# Patient Record
Sex: Male | Born: 1985 | Race: Black or African American | Hispanic: No | Marital: Single | Smoking: Current every day smoker
Health system: Southern US, Community
[De-identification: ages and names within clinical notes are randomized; demographics above are authoritative.]

## PROBLEM LIST (undated history)

## (undated) DIAGNOSIS — J45909 Unspecified asthma, uncomplicated: Secondary | ICD-10-CM

---
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
## 2011-09-15 ENCOUNTER — Emergency Department (HOSPITAL_COMMUNITY)
Admission: EM | Admit: 2011-09-15 | Discharge: 2011-09-15 | Disposition: A | Discharge status: Home / Self Care | Payer: Self-pay | Attending: Emergency Medicine | Admitting: Emergency Medicine

## 2011-09-15 ENCOUNTER — Encounter (HOSPITAL_COMMUNITY): Payer: Self-pay | Admitting: *Deleted

## 2011-09-15 DIAGNOSIS — J45901 Unspecified asthma with (acute) exacerbation: Secondary | ICD-10-CM | POA: Insufficient documentation

## 2011-09-15 HISTORY — DX: Unspecified asthma, uncomplicated: J45.909

## 2011-09-15 MED ORDER — ALBUTEROL SULFATE (5 MG/ML) 0.5% IN NEBU
5.0000 mg | INHALATION_SOLUTION | Freq: Once | RESPIRATORY_TRACT | Status: AC
  Administered 2011-09-15: 5 mg via RESPIRATORY_TRACT
  Filled 2011-09-15: qty 1

## 2011-09-15 MED ORDER — ALBUTEROL SULFATE HFA 108 (90 BASE) MCG/ACT IN AERS
2.0000 | INHALATION_SPRAY | Freq: Four times a day (QID) | RESPIRATORY_TRACT | Status: DC
  Administered 2011-09-15: 2 via RESPIRATORY_TRACT
  Filled 2011-09-15: qty 6.7

## 2011-09-15 MED ORDER — IPRATROPIUM BROMIDE 0.02 % IN SOLN
0.5000 mg | Freq: Once | RESPIRATORY_TRACT | Status: AC
  Administered 2011-09-15: 0.5 mg via RESPIRATORY_TRACT
  Filled 2011-09-15: qty 2.5

## 2011-09-15 MED ORDER — ALBUTEROL SULFATE (2.5 MG/3ML) 0.083% IN NEBU: 2.5000 mg | INHALATION_SOLUTION | Freq: Four times a day (QID) | RESPIRATORY_TRACT | Status: DC | PRN

## 2011-09-15 NOTE — ED Notes (Signed)
Patient with complains of sob and chest tightness.  Patient used his inhaler today with some relief.  He is out of inhaler at this time.  He denies fever

## 2011-09-15 NOTE — ED Provider Notes (Signed)
History     CSN: [id number]  Arrival date & time 09/15/11  1738   First MD Initiated Contact with Patient 09/15/11 2011      Chief Complaint  Patient presents with  . Asthma    (Consider location/radiation/quality/duration/timing/severity/associated sxs/prior treatment) HPI Comments: Patient with a history of asthma presents emergency department with chief complaint of shortness of breath and chest tightness.  He states that he is out of his albuterol inhaler and that he just moved to the area.  Patient reports that he did not have a primary care provider at this time.  He denies any cough, fever, night sweats, chills, chest pain, claudication, hemoptysis.  No other complaints at this time.  Patient is a 26 y.o. male presenting with asthma. The history is provided by the patient.  Asthma    Past Medical History  Diagnosis Date  . Asthma     History reviewed. No pertinent past surgical history.  No family history on file.  History  Substance Use Topics  . Smoking status: Never Smoker   . Smokeless tobacco: Not on file  . Alcohol Use: No      Review of Systems  All other systems reviewed and are negative.    Allergies  Shellfish allergy  Home Medications   Current Outpatient Rx  Name Route Sig Dispense Refill  . ALBUTEROL SULFATE HFA 108 (90 BASE) MCG/ACT IN AERS Inhalation Inhale 1 puff into the lungs every 6 (six) hours as needed. For shortness of breath    . ALBUTEROL SULFATE (2.5 MG/3ML) 0.083% IN NEBU Nebulization Take 2.5 mg by nebulization every 6 (six) hours as needed. For shortness of breath/wheezing      BP 118/72  Pulse 63  Temp 98.2 F (36.8 C) (Oral)  Resp 18  SpO2 98%  Physical Exam  Constitutional: He is oriented to person, place, and time. He appears well-developed and well-nourished. No distress.  HENT:  Head: Normocephalic and atraumatic.       Uvula midline, oropharynx clear and moist  Eyes: Conjunctivae and EOM are normal. Pupils  are equal, round, and reactive to light.  Neck: Normal range of motion.       No stridor.  Neck supple with full range of motion.  Cardiovascular: Normal rate, regular rhythm, normal heart sounds and intact distal pulses.   Pulmonary/Chest: Effort normal. He has wheezes (expiratory). He exhibits tenderness.       Patient not in respiratory distress, no accessory muscle use, nasal flaring.  Patient able to speak in full sentences.  Airway intact.  Lung auscultation positive for bilateral expiratory wheezing  Abdominal: Soft. There is no tenderness.  Musculoskeletal: Normal range of motion. He exhibits no edema and no tenderness.  Neurological: He is alert and oriented to person, place, and time.  Skin: Skin is warm and dry. No rash noted. He is not diaphoretic.  Psychiatric: He has a normal mood and affect. His behavior is normal.    ED Course  Procedures (including critical care time)  Labs Reviewed - No data to display No results found.   No diagnosis found.    MDM  Asthma exacerbation  Patient ambulated in ED with O2 saturations maintained >90, no current signs of respiratory distress. Lung exam improved after hospital treatment. Pt states they are breathing at baseline. Pt has been instructed to continue using prescribed medications and to speak with PCP about today's exacerbation.           Jaci Carrel, [place] 09/15/11  2044 

## 2011-09-15 NOTE — Discharge Instructions (Signed)
Followup with your doctor in regards to your asthma exacerbation. Read the instructions below to learn more about factors that can trigger asthma. Use her albuterol inhaler as directed. Your more than welcome to return to the emergency department if you become short of breath, your albuterol inhaler did not relieve symptoms, you are having chest pain associated with difficulty breathing, or any symptoms that are concerning to you.    IDENTIFY AND CONTROL FACTORS THAT MAKE YOUR ASTHMA WORSE A number of common things can set off or make your asthma symptoms worse (asthma triggers). Keep track of your asthma symptoms for several weeks, detailing all the environmental and emotional factors that are linked with your asthma. When you have an asthma attack, go back to your asthma diary to see which factor, or combination of factors, might have contributed to it. Once you know what these factors are, you can take steps to control many of them.  Allergies: If you have allergies and asthma, it is important to take asthma prevention steps at home. Asthma attacks (worsening of asthma symptoms) can be triggered by allergies, which can cause temporary increased inflammation of your airways. Minimizing contact with the substance to which you are allergic will help prevent an asthma attack. Animal Dander:   Some people are allergic to the flakes of skin or dried saliva from animals with fur or feathers. Keep these pets out of your home.   If you can't keep a pet outdoors, keep the pet out of your bedroom and other sleeping areas at all times, and keep the door closed.   Remove carpets and furniture covered with cloth from your home. If that is not possible, keep the pet away from fabric-covered furniture and carpets.  Dust Mites:  Many people with asthma are allergic to dust mites. Dust mites are tiny bugs that are found in every home, in mattresses, pillows, carpets, fabric-covered furniture, bedcovers, clothes,  stuffed toys, fabric, and other fabric-covered items.   Cover your mattress in a special dust-proof cover.   Cover your pillow in a special dust-proof cover, or wash the pillow each week in hot water. Water must be hotter than 130 F to kill dust mites. Cold or warm water used with detergent and bleach can also be effective.   Wash the sheets and blankets on your bed each week in hot water.   Try not to sleep or lie on cloth-covered cushions.   Call ahead when traveling and ask for a smoke-free hotel room. Bring your own bedding and pillows, in case the hotel only supplies feather pillows and down comforters, which may contain dust mites and cause asthma symptoms.   Remove carpets from your bedroom and those laid on concrete, if you can.   Keep stuffed toys out of the bed, or wash the toys weekly in hot water or cooler water with detergent and bleach.  Cockroaches:  Many people with asthma are allergic to the droppings and remains of cockroaches.   Keep food and garbage in closed containers. Never leave food out.   Use poison baits, traps, powders, gels, or paste (for example, boric acid).   If a spray is used to kill cockroaches, stay out of the room until the odor goes away.  Indoor Mold:  Fix leaky faucets, pipes, or other sources of water that have mold around them.   Clean moldy surfaces with a cleaner that has bleach in it.  Pollen and Outdoor Mold:  When pollen or mold spore counts   are high, try to keep your windows closed.   Stay indoors with windows closed from late morning to afternoon, if you can. Pollen and some mold spore counts are highest at that time.   Ask your caregiver whether you need to take or increase anti-inflammatory medicine before your allergy season starts.  Irritants:   Tobacco smoke is an irritant. If you smoke, ask your caregiver how you can quit. Ask family members to quit smoking, too. Do not allow smoking in your home or car.   If possible, do  not use a wood-burning stove, kerosene heater, or fireplace. Minimize exposure to all sources of smoke, including incense, candles, fires, and fireworks.   Try to stay away from strong odors and sprays, such as perfume, talcum powder, hair spray, and paints.   Decrease humidity in your home and use an indoor air cleaning device. Reduce indoor humidity to below 60 percent. Dehumidifiers or central air conditioners can do this.   Try to have someone else vacuum for you once or twice a week, if you can. Stay out of rooms while they are being vacuumed and for a short while afterward.   If you vacuum, use a dust mask from a hardware store, a double-layered or microfilter vacuum cleaner bag, or a vacuum cleaner with a HEPA filter.   Sulfites in foods and beverages can be irritants. Do not drink beer or wine, or eat dried fruit, processed potatoes, or shrimp if they cause asthma symptoms.   Cold air can trigger an asthma attack. Cover your nose and mouth with a scarf on cold or windy days.   Several health conditions can make asthma more difficult to manage, including runny nose, sinus infections, reflux disease, psychological stress, and sleep apnea. Your caregiver will treat these conditions, as well.   Avoid close contact with people who have a cold or the flu, since your asthma symptoms may get worse if you catch the infection from them. Wash your hands thoroughly after touching items that may have been handled by people with a respiratory infection.   Get a flu shot every year to protect against the flu virus, which often makes asthma worse for days or weeks. Also get a pneumonia shot once every five to 10 years.  Drugs:  Aspirin and other painkillers can cause asthma attacks. 10% to 20% of people with asthma have sensitivity to aspirin or a group of painkillers called non-steroidal anti-inflammatory drugs (NSAIDS), such as ibuprofen and naproxen. These drugs are used to treat pain and reduce  fevers. Asthma attacks caused by any of these medicines can be severe and even fatal. These drugs must be avoided in people who have known aspirin sensitive asthma. Products with acetaminophen are considered safe for people who have asthma. It is important that people with aspirin sensitivity read labels of all over-the-counter drugs used to treat pain, colds, coughs, and fever.   Beta blockers and ACE inhibitors are other drugs which you should discuss with your caregiver, in relation to your asthma.   EXERCISE  If you have exercise-induced asthma, or are planning vigorous exercise, or exercise in cold, humid, or dry environments, prevent exercise-induced asthma by following your caregiver's advice regarding asthma treatment before exercising.  RESOURCE GUIDE  Chronic Pain Problems: Contact [facility]  [phone] Patients need to be referred by their primary care doctor.  Insufficient Money for Medicine: Contact United Way:  call "211" or [facility] 271-5999.  No Primary Care Doctor: -   Call [facility]  832-8000 - can help you locate a primary care doctor that  accepts your insurance, provides certain services, etc. - Physician Referral Service- 1-800-533-3463  Agencies that provide inexpensive medical care: - [facility]  [phone] - [facility]  [phone] - [facility]  [phone] - [facility]  [phone] - Planned Parenthood  [phone] - [facility]  [phone]  Medicaid-accepting [place] Providers: - [facility]- [address]  [phone], Mon-Fri 9am-7pm, Sat 9am-1pm - [facility]- [address], [address]  [phone] - [facility]- [address]  [phone] - [facility]- [address] [place] [address]  [phone] - Ve[facility] B[facility]- [address], [phone]  Only accepts  [place] Access Medicaid patients after they have their name  applied to their card  Self Pay (no insurance) in [place]: - Sickle Cell Patients: Dr Eric Dean, [facility]  [address], [phone] - [facility]- [address]  [phone]       -      [facility]- [address]       -     [facility]- see information above (Speak to Pam H if you do not have insurance)       -  [facility]- [address], [phone]       -  [facility] [place]- [address],  [phone]       -  [facility]- [address] [place] [address], [phone]       -  Dr Osei-Bonsu-  [address], [place], [phone]       -  [facility]- [address], [phone]       -  [facility]- [address] [place] [address], [phone], also [address], [phone]       -    [facility]- [address], [phone], 1st & 3rd Saturday   every month, 10am-1pm  1) Find a Doctor and Pay Out of Pocket Although you won't have to find out who is covered by your insurance plan, it is a good idea to ask around and get recommendations. You will then need to call the office and see if the doctor you have chosen will accept you as a new patient and what types of options they offer for patients who are self-pay. Some doctors offer discounts or will set up payment plans for their patients who do not have insurance, but you will need to ask so you aren't surprised when you get to your appointment.  2) Contact Your Local Health Department Not all health departments have doctors that can see patients for sick visits, but many do, so it is worth a call to see if yours does. If you don't know where your local health department is, you can check in your phone book. The CDC also has a tool to help you locate your state's health department, and many state websites also have listings of all of their  local health departments.  3) Find a Walk-in Clinic If your illness is not likely to be very severe or complicated, you may want to try a walk in clinic. These are popping up all over the country in pharmacies,   drugstores, and shopping centers. They're usually staffed by nurse practitioners or physician assistants that have been trained to treat common illnesses and complaints. They're usually fairly quick and inexpensive. However, if you have serious medical issues or chronic medical problems, these are probably not your best option  STD Testing - [place] [facility] [place], [facility], [address], [place], phone [phone] or 1-877-539-9860.  Monday - Friday, call for an appointment. - [place] [facility] [place], [facility], [address], [place], phone [phone] or 1-877-539-9860.  Monday - Friday, call for an appointment.  Abuse/Neglect: - [place] Child Abuse Hotline (336) 641-3795 - [place] Child Abuse Hotline 800-378-5315 (After Hours)  Emergency Shelter:  [facility] (336) 271-5985  Maternity Homes: - [facility] (336) 275-9566 - [facility] (704) 372-4663  MRSA Hotline #:   832-7006  [place] Resources  [facility] [place]  [employer] [place] [facility] [address]                 [address]         [address]  [place]                                               [place]                              [place] Phone:  [phone]                                  Phone:  [phone]                   Phone:  [phone]  [place] [facility], [phone] - [place] Services - [facility]- 1-888-581-9988       -     [facility] in [place], [address],                                  336-349-4454, Insurance  [place] Child Abuse  Hotline (336) 342-1394 or (336) 342-3537 (After Hours)   Behavioral Health Services  Substance Abuse Resources: - [facility]  336-882-2125 - [facility] 336-784-9470 - [facility] 336-285-9073 - [facility] 336-845-3988 - Residential & Outpatient Substance Abuse Program  800-659-3381  Psychological Services: - [facility]  832-9600 - [facility]  [phone] - [place], [address], [employer], ACCESS LINE: 1-800-853-5163 or 336-641-4981, Http://www.[facility].com/services/adult.htm  Dental Assistance  If unable to pay or uninsured, contact:  [facility] or [place] [facility] to become qualified for the adult dental clinic.  Patients with Medicaid: [facility] [address], [phone], [phone]  If unable to pay, or uninsured, contact [facility] (271-5999) or [place] [facility] (641-3152 in , [phone] in [place]) to become qualified for the adult dental clinic  Other Low-Cost Community Dental Services: - [facility]- [address], [place], , [address], [phone], Ext.   123, 2nd and 4th Thursday of the month at 6:30am.  10 clients each day by appointment, can sometimes see walk-in patients if someone does not show for an appointment. - [facility]- [address], [place], [place], [address], [phone] - [facility]- [address], [place], [place], [address], [phone] - [place] [facility]- [phone] - [facility]- [phone] - [place]- [phone]      

## 2011-09-15 NOTE — ED Notes (Signed)
Patient states that he has a history of asthma.  States that he ran out of his medication and needed help to get more. He is in no acute distress at this time.

## 2011-09-16 NOTE — ED Provider Notes (Signed)
Medical screening examination/treatment/procedure(s) were performed by non-physician practitioner and as supervising physician I was immediately available for consultation/collaboration.   Carleene Cooper III, MD 09/16/11 1357

## 2012-04-11 ENCOUNTER — Emergency Department (HOSPITAL_COMMUNITY)
Admission: EM | Admit: 2012-04-11 | Discharge: 2012-04-11 | Disposition: A | Discharge status: Home / Self Care | Payer: Self-pay | Attending: Emergency Medicine | Admitting: Emergency Medicine

## 2012-04-11 ENCOUNTER — Encounter (HOSPITAL_COMMUNITY): Payer: Self-pay | Admitting: Emergency Medicine

## 2012-04-11 DIAGNOSIS — R062 Wheezing: Secondary | ICD-10-CM | POA: Insufficient documentation

## 2012-04-11 DIAGNOSIS — J3489 Other specified disorders of nose and nasal sinuses: Secondary | ICD-10-CM | POA: Insufficient documentation

## 2012-04-11 DIAGNOSIS — J45909 Unspecified asthma, uncomplicated: Secondary | ICD-10-CM | POA: Insufficient documentation

## 2012-04-11 DIAGNOSIS — R6883 Chills (without fever): Secondary | ICD-10-CM | POA: Insufficient documentation

## 2012-04-11 DIAGNOSIS — IMO0001 Reserved for inherently not codable concepts without codable children: Secondary | ICD-10-CM | POA: Insufficient documentation

## 2012-04-11 DIAGNOSIS — Z79899 Other long term (current) drug therapy: Secondary | ICD-10-CM | POA: Insufficient documentation

## 2012-04-11 DIAGNOSIS — J069 Acute upper respiratory infection, unspecified: Secondary | ICD-10-CM | POA: Insufficient documentation

## 2012-04-11 MED ORDER — NAPROXEN 500 MG PO TABS: 500.0000 mg | ORAL_TABLET | Freq: Two times a day (BID) | ORAL | Status: DC

## 2012-04-11 MED ORDER — ALBUTEROL SULFATE HFA 108 (90 BASE) MCG/ACT IN AERS
2.0000 | INHALATION_SPRAY | Freq: Once | RESPIRATORY_TRACT | Status: AC
  Administered 2012-04-11: 2 via RESPIRATORY_TRACT
  Filled 2012-04-11: qty 6.7

## 2012-04-11 MED ORDER — HYDROCODONE-HOMATROPINE 5-1.5 MG/5ML PO SYRP: 5.0000 mL | ORAL_SOLUTION | Freq: Four times a day (QID) | ORAL | Status: DC | PRN

## 2012-04-11 MED ORDER — HYDROCOD POLST-CHLORPHEN POLST 10-8 MG/5ML PO LQCR
5.0000 mL | Freq: Once | ORAL | Status: AC
  Administered 2012-04-11: 5 mL via ORAL
  Filled 2012-04-11: qty 5

## 2012-04-11 MED ORDER — ACETAMINOPHEN 325 MG PO TABS
650.0000 mg | ORAL_TABLET | Freq: Once | ORAL | Status: AC
  Administered 2012-04-11: 650 mg via ORAL
  Filled 2012-04-11: qty 2

## 2012-04-11 NOTE — ED Notes (Signed)
PT. REPORTS COUGH WITH SLIGHT SOB ONSET YESTERDAY " I DON'T FEEL GOOD " WITH FEVER UNRELIEVED BY OTC ALKA ZELTZER.

## 2012-04-11 NOTE — ED Provider Notes (Signed)
Medical screening examination/treatment/procedure(s) were performed by non-physician practitioner and as supervising physician I was immediately available for consultation/collaboration.  Mccade Sullenberger K Caio Devera, MD 04/11/12 0520 

## 2012-04-11 NOTE — ED Provider Notes (Signed)
History     CSN: [id number]  Arrival date & time 04/11/12  0126   First MD Initiated Contact with Patient 04/11/12 0132      Chief Complaint  Patient presents with  . Cough    (Consider location/radiation/quality/duration/timing/severity/associated sxs/prior treatment) Patient is a 27 y.o. male presenting with cough. The history is provided by the patient.  Cough This is a new problem. The current episode started more than 2 days ago. The problem occurs every few minutes. The problem has not changed since onset.The cough is non-productive. The maximum temperature recorded prior to his arrival was 101 to 101.9 F. The fever has been present for 1 to 2 days. Associated symptoms include chills, sweats, myalgias and wheezing. Pertinent negatives include no chest pain, no weight loss, no ear congestion, no ear pain, no headaches, no rhinorrhea, no sore throat, no shortness of breath and no eye redness. He has tried nothing for the symptoms. He is not a smoker. His past medical history is significant for asthma.    Past Medical History  Diagnosis Date  . Asthma     History reviewed. No pertinent past surgical history.  No family history on file.  History  Substance Use Topics  . Smoking status: Never Smoker   . Smokeless tobacco: Not on file  . Alcohol Use: No      Review of Systems  Constitutional: Positive for chills. Negative for fever, weight loss, diaphoresis and activity change.  HENT: Positive for congestion. Negative for ear pain, sore throat, rhinorrhea and neck pain.   Eyes: Negative for redness.  Respiratory: Positive for cough and wheezing. Negative for shortness of breath.   Cardiovascular: Negative for chest pain.  Genitourinary: Negative for dysuria.  Musculoskeletal: Positive for myalgias.  Skin: Negative for color change and wound.  Neurological: Negative for headaches.  All other systems reviewed and are negative.    Allergies  Shellfish allergy  Home  Medications   Current Outpatient Rx  Name  Route  Sig  Dispense  Refill  . ALBUTEROL SULFATE HFA 108 (90 BASE) MCG/ACT IN AERS   Inhalation   Inhale 1 puff into the lungs every 6 (six) hours as needed. For shortness of breath         . ALBUTEROL SULFATE (2.5 MG/3ML) 0.083% IN NEBU   Nebulization   Take 2.5 mg by nebulization every 6 (six) hours as needed. For shortness of breath/wheezing         . ALBUTEROL SULFATE (2.5 MG/3ML) 0.083% IN NEBU   Nebulization   Take 3 mLs (2.5 mg total) by nebulization every 6 (six) hours as needed for wheezing.   75 mL   12     BP 120/80  Pulse 130  Temp 101 F (38.3 C) (Oral)  Resp 16  SpO2 93%  Physical Exam  Nursing note and vitals reviewed. Constitutional: He is oriented to person, place, and time. He appears well-developed and well-nourished. No distress.  HENT:  Head: Normocephalic and atraumatic.       Normal Oropharynx with out exudates, MMM. Ear canal and TM normal bilaterally. Nasal congestion without sinus tenderness.   Eyes: Conjunctivae normal and EOM are normal. Pupils are equal, round, and reactive to light.       No pain w EOM. Conjunctiva normal    Neck: Normal range of motion. Neck supple.       Soft, no nuchal rigidity. No lymphadenopathy  Cardiovascular: Regular rhythm, normal heart sounds and intact distal pulses.  RRR, no aberrancy on auscultation  Pulmonary/Chest: Effort normal.       LCAB, no respiratory distress  Abdominal: Soft. Bowel sounds are normal.  Musculoskeletal: Normal range of motion. He exhibits no edema.       Diffuse myalgias, no bony ttp or decreased ROM  Neurological: He is alert and oriented to person, place, and time.  Skin: He is not diaphoretic.       No rash  Psychiatric: His behavior is normal.    ED Course  Procedures (including critical care time)  Labs Reviewed - No data to display No results found.   No diagnosis found.  BP 120/80  Pulse 130  Temp 101 F (38.3  C) (Oral)  Resp 16  SpO2 93%  Pt states that he does not want an IV and does not want a chest xray stating that they "cost to much money."  MDM  URI Patients symptoms are consistent with URI, likely viral etiology. Discussed that antibiotics are not indicated for viral infections. Pt will be discharged with symptomatic treatment.  Verbalizes understanding and is agreeable with plan. Pt is hemodynamically stable & in NAD prior to dc. Strict return precautions discussed and pt verbalized understanding.          Jaci Carrel, [place] 04/11/12 [phone]

## 2012-07-24 ENCOUNTER — Emergency Department (HOSPITAL_COMMUNITY)
Admission: EM | Admit: 2012-07-24 | Discharge: 2012-07-24 | Disposition: A | Discharge status: Home / Self Care | Payer: Self-pay | Attending: Emergency Medicine | Admitting: Emergency Medicine

## 2012-07-24 ENCOUNTER — Encounter (HOSPITAL_COMMUNITY): Payer: Self-pay | Admitting: *Deleted

## 2012-07-24 DIAGNOSIS — Z79899 Other long term (current) drug therapy: Secondary | ICD-10-CM | POA: Insufficient documentation

## 2012-07-24 DIAGNOSIS — R5383 Other fatigue: Secondary | ICD-10-CM | POA: Insufficient documentation

## 2012-07-24 DIAGNOSIS — H579 Unspecified disorder of eye and adnexa: Secondary | ICD-10-CM | POA: Insufficient documentation

## 2012-07-24 DIAGNOSIS — IMO0002 Reserved for concepts with insufficient information to code with codable children: Secondary | ICD-10-CM | POA: Insufficient documentation

## 2012-07-24 DIAGNOSIS — H5789 Other specified disorders of eye and adnexa: Secondary | ICD-10-CM | POA: Insufficient documentation

## 2012-07-24 DIAGNOSIS — R5381 Other malaise: Secondary | ICD-10-CM | POA: Insufficient documentation

## 2012-07-24 DIAGNOSIS — R0602 Shortness of breath: Secondary | ICD-10-CM | POA: Insufficient documentation

## 2012-07-24 DIAGNOSIS — J45901 Unspecified asthma with (acute) exacerbation: Secondary | ICD-10-CM | POA: Insufficient documentation

## 2012-07-24 DIAGNOSIS — J3489 Other specified disorders of nose and nasal sinuses: Secondary | ICD-10-CM | POA: Insufficient documentation

## 2012-07-24 MED ORDER — PREDNISONE 20 MG PO TABS: 40.0000 mg | ORAL_TABLET | Freq: Every day | ORAL | Status: DC

## 2012-07-24 MED ORDER — IPRATROPIUM BROMIDE 0.02 % IN SOLN
0.5000 mg | Freq: Once | RESPIRATORY_TRACT | Status: AC
  Administered 2012-07-24: 0.5 mg via RESPIRATORY_TRACT
  Filled 2012-07-24: qty 2.5

## 2012-07-24 MED ORDER — ALBUTEROL SULFATE HFA 108 (90 BASE) MCG/ACT IN AERS
2.0000 | INHALATION_SPRAY | Freq: Once | RESPIRATORY_TRACT | Status: AC
  Administered 2012-07-24: 2 via RESPIRATORY_TRACT
  Filled 2012-07-24: qty 6.7

## 2012-07-24 MED ORDER — ALBUTEROL SULFATE (5 MG/ML) 0.5% IN NEBU
2.5000 mg | INHALATION_SOLUTION | Freq: Once | RESPIRATORY_TRACT | Status: AC
  Administered 2012-07-24: 2.5 mg via RESPIRATORY_TRACT
  Filled 2012-07-24: qty 0.5

## 2012-07-24 MED ORDER — PREDNISONE 20 MG PO TABS
40.0000 mg | ORAL_TABLET | Freq: Once | ORAL | Status: AC
  Administered 2012-07-24: 40 mg via ORAL
  Filled 2012-07-24: qty 2

## 2012-07-24 MED ORDER — CETIRIZINE HCL 10 MG PO TABS: 10.0000 mg | ORAL_TABLET | Freq: Every day | ORAL | Status: DC

## 2012-07-24 NOTE — ED Provider Notes (Signed)
History     CSN: [id number]  Arrival date & time 07/24/12  0721   First MD Initiated Contact with Patient 07/24/12 [phone]      Chief Complaint  Patient presents with  . Asthma    (Consider location/radiation/quality/duration/timing/severity/associated sxs/prior treatment) HPI Comments: Patient is a 27 y/o male with a hx of asthma who presents for increased SOB over the last 4 days. Patient admits to having 2 episodes of SOB over the last 4 days, getting relief with an albuterol inhaler. Patient states SOB symptoms are worse "when the pollen is out", and he endorses associated itchy/watery eye, nasal congestion, and clear rhinorrhea. Patient admits to an audible wheeze and denies fever, cough, vision changes, sore throat, chest pain, abdominal pain, N/V/D, and LOC. Patient denies hx of hospitalizations for asthma or any previous intubations.  Patient is a 27 y.o. male presenting with asthma. The history is provided by the patient. No language interpreter was used.  Asthma This is a recurrent problem. The current episode started in the past 7 days. The problem occurs every several days. The problem has been unchanged. Associated symptoms include fatigue. Pertinent negatives include no abdominal pain, change in bowel habit, chest pain, congestion, coughing, fever, headaches, nausea, neck pain, numbness, rash, sore throat, visual change, vomiting or weakness. Exacerbated by: pollen. Treatments tried: albuterol inhaler. The treatment provided moderate relief.    Past Medical History  Diagnosis Date  . Asthma     History reviewed. No pertinent past surgical history.  No family history on file.  History  Substance Use Topics  . Smoking status: Never Smoker   . Smokeless tobacco: Not on file  . Alcohol Use: No     Review of Systems  Constitutional: Positive for fatigue. Negative for fever.  HENT: Negative for congestion, sore throat and neck pain.   Respiratory: Negative for cough.    Cardiovascular: Negative for chest pain.  Gastrointestinal: Negative for nausea, vomiting, abdominal pain and change in bowel habit.  Skin: Negative for rash.  Neurological: Negative for weakness, numbness and headaches.    Allergies  Shellfish allergy  Home Medications   Current Outpatient Rx  Name  Route  Sig  Dispense  Refill  . albuterol (PROVENTIL HFA;VENTOLIN HFA) 108 (90 BASE) MCG/ACT inhaler   Inhalation   Inhale 1 puff into the lungs every 6 (six) hours as needed. For shortness of breath         . albuterol (PROVENTIL) (2.5 MG/3ML) 0.083% nebulizer solution   Nebulization   Take 2.5 mg by nebulization every 6 (six) hours as needed. For shortness of breath/wheezing         . cetirizine (ZYRTEC ALLERGY) 10 MG tablet   Oral   Take 1 tablet (10 mg total) by mouth daily.   30 tablet   1   . predniSONE (DELTASONE) 20 MG tablet   Oral   Take 2 tablets (40 mg total) by mouth daily.   10 tablet   0     BP 122/79  Pulse 93  Temp(Src) 98.1 F (36.7 C) (Oral)  Resp 22  SpO2 95%  Physical Exam  Nursing note and vitals reviewed. Constitutional: He is oriented to person, place, and time. He appears well-developed and well-nourished. No distress.  HENT:  Head: Normocephalic and atraumatic.  Mouth/Throat: Oropharynx is clear and moist. No oropharyngeal exudate.  Eyes: Conjunctivae and EOM are normal. Pupils are equal, round, and reactive to light. No scleral icterus.  Neck: Normal range of motion.  Neck supple.  Cardiovascular: Normal rate, regular rhythm, normal heart sounds and intact distal pulses.   Pulmonary/Chest: Effort normal. No respiratory distress. He has wheezes. He has no rales. He exhibits no tenderness.  Decreased breath sounds with diffuse inspiratory and expiratory wheezes.   Abdominal: Soft. He exhibits no distension. There is no tenderness. There is no rebound and no guarding.  Musculoskeletal: Normal range of motion. He exhibits no edema.   Lymphadenopathy:    He has no cervical adenopathy.  Neurological: He is alert and oriented to person, place, and time.  Skin: Skin is warm and dry. No rash noted. He is not diaphoretic. No erythema.  Psychiatric: He has a normal mood and affect. His behavior is normal.    ED Course  Procedures (including critical care time)  Labs Reviewed - No data to display No results found.   1. Asthma exacerbation, mild     MDM  Patient with hx of asthma presents for increasing SOB x 4 days. Patient endorses associated itchy/watery eye, nasal congestion, and clear rhinorrhea; denies fever, lightheadedness, dizziness, and cough. Albuterol/atrovent neb tx ordered as patient diffusely wheezing on physical exam. Will reassess post neb tx.   Patient without inspiratory or expiratory wheezes on auscultation following neb treatment. 40mg  prednisone also given. Patient states symptoms have greatly improved since arrival. Patient ambulates without hypoxia. Stable for d/c with PCP follow up; albuterol inhaler and resource guide provided. Patient also given script for zyrtec and prednisone for symptoms. Indications for ED return discussed. Patient states comfort and understanding with this d/c plan with no unaddressed concerns.    Filed Vitals:   07/24/12 0736 07/24/12 0830 07/24/12 0900  BP: 114/72 122/79 118/81  Pulse: 93 93 81  Temp: 98.1 F (36.7 C)    TempSrc: Oral    Resp: 22    SpO2: 95% 95% 97%          Antony Madura, PA-C 07/26/12 1512

## 2012-07-24 NOTE — ED Notes (Addendum)
Pt states that he has had wheezing, cough and is out of his ventolin inhaler since yesterday. NAD noted. A&Ox4

## 2012-07-24 NOTE — ED Notes (Signed)
Pt ambulated in hallway. o2 sats ranged from 94-97%. Hr in 80's

## 2012-07-24 NOTE — ED Notes (Signed)
Pt has finished his breathing treatment; pt resting, watching tv

## 2012-07-30 NOTE — ED Provider Notes (Signed)
Medical screening examination/treatment/procedure(s) were performed by non-physician practitioner and as supervising physician I was immediately available for consultation/collaboration.   Chimene Salo E Saveah Bahar, MD 07/30/12 0728 

## 2012-11-30 ENCOUNTER — Encounter (HOSPITAL_COMMUNITY): Payer: Self-pay

## 2012-11-30 ENCOUNTER — Emergency Department (HOSPITAL_COMMUNITY)
Admission: EM | Admit: 2012-11-30 | Discharge: 2012-12-01 | Disposition: A | Discharge status: Home / Self Care | Payer: Self-pay | Attending: Emergency Medicine | Admitting: Emergency Medicine

## 2012-11-30 DIAGNOSIS — J45901 Unspecified asthma with (acute) exacerbation: Secondary | ICD-10-CM | POA: Insufficient documentation

## 2012-11-30 DIAGNOSIS — F17200 Nicotine dependence, unspecified, uncomplicated: Secondary | ICD-10-CM | POA: Insufficient documentation

## 2012-11-30 DIAGNOSIS — Z79899 Other long term (current) drug therapy: Secondary | ICD-10-CM | POA: Insufficient documentation

## 2012-11-30 DIAGNOSIS — J45909 Unspecified asthma, uncomplicated: Secondary | ICD-10-CM

## 2012-11-30 MED ORDER — ALBUTEROL SULFATE (5 MG/ML) 0.5% IN NEBU
5.0000 mg | INHALATION_SOLUTION | Freq: Once | RESPIRATORY_TRACT | Status: AC
  Administered 2012-12-01: 5 mg via RESPIRATORY_TRACT
  Filled 2012-11-30: qty 1

## 2012-11-30 MED ORDER — IPRATROPIUM BROMIDE 0.02 % IN SOLN
0.5000 mg | Freq: Once | RESPIRATORY_TRACT | Status: AC
  Administered 2012-12-01: 0.5 mg via RESPIRATORY_TRACT
  Filled 2012-11-30: qty 2.5

## 2012-11-30 NOTE — ED Notes (Signed)
Pt c/o wheezing and short of breath x2 days, pt reports he has a hx of asthma but has ran out of his albuterol inhaler and used the last of his neb treatment. Pt states he was unable to buy his medication this month

## 2012-11-30 NOTE — ED Provider Notes (Signed)
CSN: [id number]     Arrival date & time 11/30/12  2256 History   First MD Initiated Contact with Patient 11/30/12 2311     Chief Complaint  Patient presents with  . Asthma   (Consider location/radiation/quality/duration/timing/severity/associated sxs/prior Treatment) HPI Henry Schroeder is a 27 y.o. male who presents to emergency department with complaint of shortness of breath. Patient states that he has history of asthma and developed increased chest tightness and shortness of breath about 3 days ago. Patient states she normally takes inhaler symptoms but ran out. Patient states that he is a smoker but has been cutting down cigarettes in the last few months. Patient denies any fever, chills, malaise, productive cough. Patient states he does have dry cough which is not unusual for his asthma. Patient states he does not have a primary care doctor at this time and unable to get his albuterol and nebulizer medications refilled. Patient denies any chest pain or shortness of breath at this time. Nothing making his symptoms better or worse.    Past Medical History  Diagnosis Date  . Asthma    History reviewed. No pertinent past surgical history. History reviewed. No pertinent family history. History  Substance Use Topics  . Smoking status: Never Smoker   . Smokeless tobacco: Not on file  . Alcohol Use: No    Review of Systems  Constitutional: Negative for fever and chills.  HENT: Negative for neck pain and neck stiffness.   Respiratory: Positive for cough and shortness of breath. Negative for chest tightness.   Cardiovascular: Negative for chest pain, palpitations and leg swelling.  Gastrointestinal: Negative for nausea, vomiting, abdominal pain, diarrhea and abdominal distention.  Genitourinary: Negative for dysuria, urgency, frequency and hematuria.  Skin: Negative for rash.  Allergic/Immunologic: Negative for immunocompromised state.  Neurological: Negative for dizziness, weakness,  light-headedness, numbness and headaches.    Allergies  Shellfish allergy  Home Medications   Current Outpatient Rx  Name  Route  Sig  Dispense  Refill  . albuterol (PROVENTIL HFA;VENTOLIN HFA) 108 (90 BASE) MCG/ACT inhaler   Inhalation   Inhale 1 puff into the lungs every 6 (six) hours as needed for wheezing or shortness of breath.          Marland Kitchen albuterol (PROVENTIL) (2.5 MG/3ML) 0.083% nebulizer solution   Nebulization   Take 2.5 mg by nebulization daily as needed for wheezing or shortness of breath.           BP 113/72  Pulse 74  Temp(Src) 98.6 F (37 C) (Oral)  Resp 20  SpO2 97% Physical Exam  Nursing note and vitals reviewed. Constitutional: He appears well-developed and well-nourished. No distress.  HENT:  Head: Normocephalic and atraumatic.  Eyes: Conjunctivae are normal.  Neck: Neck supple.  Cardiovascular: Normal rate, regular rhythm and normal heart sounds.   Pulmonary/Chest: Effort normal. No respiratory distress. He has wheezes. He has no rales.  Inspiratory and expiratory wheezes in the lung fields. Decreased air movement bilaterally  Abdominal: There is no tenderness.  Musculoskeletal: He exhibits no edema.  Skin: Skin is warm and dry.    ED Course  Procedures (including critical care time) Labs Review Labs Reviewed - No data to display Imaging Review No results found.  MDM   1. Asthma     Patient with history of asthma, ran out of his inhaler. Patient with diffuse wheezing and decreased air movement on exam. Patient was given a neb in emergency department. His wheezing has resolved and his breathing has  improved. Patient does not have a fever, cough, chest pain. I do not think any further imaging or testing is indicated at this time. I will give him an inhaler to go. Prescription for nebulizer albuterol given. Patient is to followup with his primary care Dr. Gaylyn Rong signs are normal  Filed Vitals:   11/30/12 2306 12/01/12 0023 12/01/12 0057  BP:  113/72    Pulse: 74    Temp: 98.6 F (37 C)    TempSrc: Oral    Resp: 20    SpO2: 97% 100% 99%       Thora Scherman A [place] Rosamond, PA-C 12/01/12 0327

## 2012-12-01 MED ORDER — ALBUTEROL SULFATE (2.5 MG/3ML) 0.083% IN NEBU: 2.5000 mg | INHALATION_SOLUTION | Freq: Four times a day (QID) | RESPIRATORY_TRACT | Status: DC | PRN

## 2012-12-01 MED ORDER — ALBUTEROL SULFATE HFA 108 (90 BASE) MCG/ACT IN AERS
2.0000 | INHALATION_SPRAY | Freq: Once | RESPIRATORY_TRACT | Status: AC
  Administered 2012-12-01: 2 via RESPIRATORY_TRACT
  Filled 2012-12-01: qty 6.7

## 2012-12-01 NOTE — ED Notes (Signed)
Resp. Called [facility] they will be here to give tx.

## 2012-12-01 NOTE — ED Provider Notes (Signed)
Medical screening examination/treatment/procedure(s) were performed by non-physician practitioner and as supervising physician I was immediately available for consultation/collaboration.   Junius Argyle, MD 12/01/12 [phone]

## 2013-03-08 ENCOUNTER — Encounter (HOSPITAL_COMMUNITY): Payer: Self-pay | Admitting: Emergency Medicine

## 2013-03-08 ENCOUNTER — Emergency Department (HOSPITAL_COMMUNITY)
Admission: EM | Admit: 2013-03-08 | Discharge: 2013-03-09 | Disposition: A | Discharge status: Home / Self Care | Payer: Self-pay | Attending: Emergency Medicine | Admitting: Emergency Medicine

## 2013-03-08 DIAGNOSIS — J45901 Unspecified asthma with (acute) exacerbation: Secondary | ICD-10-CM | POA: Insufficient documentation

## 2013-03-08 DIAGNOSIS — Z79899 Other long term (current) drug therapy: Secondary | ICD-10-CM | POA: Insufficient documentation

## 2013-03-08 MED ORDER — ALBUTEROL SULFATE (5 MG/ML) 0.5% IN NEBU
5.0000 mg | INHALATION_SOLUTION | Freq: Once | RESPIRATORY_TRACT | Status: AC
  Administered 2013-03-08: 5 mg via RESPIRATORY_TRACT
  Filled 2013-03-08: qty 1

## 2013-03-08 NOTE — ED Notes (Signed)
Pt. reports asthma attack this evening with dry cough and wheezing . Pt. ran out of his MDI today .

## 2013-03-09 MED ORDER — ALBUTEROL SULFATE HFA 108 (90 BASE) MCG/ACT IN AERS
2.0000 | INHALATION_SPRAY | RESPIRATORY_TRACT | Status: DC | PRN
  Administered 2013-03-09 (×2): 2 via RESPIRATORY_TRACT
  Filled 2013-03-09 (×2): qty 6.7

## 2013-03-09 MED ORDER — ALBUTEROL SULFATE (5 MG/ML) 0.5% IN NEBU
5.0000 mg | INHALATION_SOLUTION | Freq: Once | RESPIRATORY_TRACT | Status: AC
  Administered 2013-03-09: 5 mg via RESPIRATORY_TRACT
  Filled 2013-03-09: qty 1

## 2013-03-09 MED ORDER — PREDNISONE 20 MG PO TABS
60.0000 mg | ORAL_TABLET | Freq: Once | ORAL | Status: AC
  Administered 2013-03-09: 60 mg via ORAL
  Filled 2013-03-09: qty 3

## 2013-03-09 MED ORDER — PREDNISONE 20 MG PO TABS: 60.0000 mg | ORAL_TABLET | Freq: Every day | ORAL | Status: DC

## 2013-03-09 NOTE — ED Provider Notes (Signed)
CSN: [id number]     Arrival date & time 03/08/13  2107 History   First MD Initiated Contact with Patient 03/09/13 0000     Chief Complaint  Patient presents with  . Asthma   (Consider location/radiation/quality/duration/timing/severity/associated sxs/prior Treatment) Patient is a 27 y.o. male presenting with asthma. The history is provided by the patient.  Asthma This is a recurrent problem. The current episode started 12 to 24 hours ago. The problem occurs constantly. The problem has been gradually worsening. Associated symptoms include shortness of breath. Pertinent negatives include no chest pain and no abdominal pain. Nothing (ate at a [place] restraunt and belives he may have been exposed to somehting there - no itching or rash) aggravates the symptoms. Nothing relieves the symptoms. He has tried nothing for the symptoms.   Feels like his typical asthma with wheezes. mild to MOD in severity, no F/C, no cough. Ran out of inhaler, presents here requesting one tonight.   Past Medical History  Diagnosis Date  . Asthma    History reviewed. No pertinent past surgical history. No family history on file. History  Substance Use Topics  . Smoking status: Never Smoker   . Smokeless tobacco: Not on file  . Alcohol Use: No    Review of Systems  Constitutional: Negative for fever and chills.  Respiratory: Positive for shortness of breath and wheezing.   Cardiovascular: Negative for chest pain.  Gastrointestinal: Negative for vomiting and abdominal pain.  Musculoskeletal: Negative for back pain.  Skin: Negative for rash.  Neurological: Negative for weakness and numbness.  All other systems reviewed and are negative.    Allergies  Shellfish allergy  Home Medications   Current Outpatient Rx  Name  Route  Sig  Dispense  Refill  . albuterol (PROVENTIL HFA;VENTOLIN HFA) 108 (90 BASE) MCG/ACT inhaler   Inhalation   Inhale 1 puff into the lungs every 6 (six) hours as needed for  wheezing or shortness of breath.          Marland Kitchen albuterol (PROVENTIL) (2.5 MG/3ML) 0.083% nebulizer solution   Nebulization   Take 3 mLs (2.5 mg total) by nebulization every 6 (six) hours as needed for wheezing.   75 mL   12    BP 147/99  Pulse 101  Temp(Src) 98.7 F (37.1 C) (Oral)  Resp 24  Ht 5\' 7"  (1.702 m)  Wt 175 lb 4.8 oz (79.516 kg)  BMI 27.45 kg/m2  SpO2 92% Physical Exam  Constitutional: He is oriented to person, place, and time. He appears well-developed and well-nourished.  HENT:  Head: Normocephalic and atraumatic.  Eyes: EOM are normal. Pupils are equal, round, and reactive to light.  Neck: Neck supple.  Cardiovascular: Normal rate, regular rhythm and intact distal pulses.   Pulmonary/Chest: Effort normal. No stridor. No respiratory distress.  Mild bilat exp wheezes  Abdominal: Soft. He exhibits no distension. There is no tenderness.  Musculoskeletal: Normal range of motion. He exhibits no edema.  Neurological: He is alert and oriented to person, place, and time.  Skin: Skin is warm and dry.    ED Course  Procedures (including critical care time)  1:22 AM improved after albuterol and steroids. Lung sounds improved and PT requesting to be discharged. Plan outpatient f/u, RX pred x 5 days provided with return precautions.   MDM  Dx: Asthma exacerbation  Improved with medications - stable for discharge VS reviewed - improved RA O2 sats to 98%     Sunnie Nielsen, MD 03/09/13 [phone]

## 2013-03-09 NOTE — ED Notes (Signed)
Left lung inspiratory wheezes no longer present.  Lung sounds still diminished bases, L>R.  Pt reports he feels better.

## 2013-04-17 ENCOUNTER — Encounter (HOSPITAL_COMMUNITY): Payer: Self-pay | Admitting: Emergency Medicine

## 2013-04-17 ENCOUNTER — Emergency Department (HOSPITAL_COMMUNITY)
Admission: EM | Admit: 2013-04-17 | Discharge: 2013-04-17 | Disposition: A | Discharge status: Home / Self Care | Payer: Self-pay | Attending: Emergency Medicine | Admitting: Emergency Medicine

## 2013-04-17 DIAGNOSIS — IMO0002 Reserved for concepts with insufficient information to code with codable children: Secondary | ICD-10-CM | POA: Insufficient documentation

## 2013-04-17 DIAGNOSIS — J45901 Unspecified asthma with (acute) exacerbation: Secondary | ICD-10-CM | POA: Insufficient documentation

## 2013-04-17 DIAGNOSIS — J069 Acute upper respiratory infection, unspecified: Secondary | ICD-10-CM | POA: Insufficient documentation

## 2013-04-17 MED ORDER — ALBUTEROL SULFATE (2.5 MG/3ML) 0.083% IN NEBU
5.0000 mg | INHALATION_SOLUTION | Freq: Once | RESPIRATORY_TRACT | Status: AC
  Administered 2013-04-17: 5 mg via RESPIRATORY_TRACT
  Filled 2013-04-17: qty 6

## 2013-04-17 MED ORDER — ALBUTEROL SULFATE HFA 108 (90 BASE) MCG/ACT IN AERS
2.0000 | INHALATION_SPRAY | RESPIRATORY_TRACT | Status: DC | PRN
  Administered 2013-04-17: 2 via RESPIRATORY_TRACT
  Filled 2013-04-17: qty 6.7

## 2013-04-17 MED ORDER — IPRATROPIUM BROMIDE 0.02 % IN SOLN
0.5000 mg | Freq: Once | RESPIRATORY_TRACT | Status: AC
  Administered 2013-04-17: 0.5 mg via RESPIRATORY_TRACT
  Filled 2013-04-17: qty 2.5

## 2013-04-17 NOTE — ED Notes (Signed)
Pt states he ran out of his albuterol inhaler that he was given here.  Pt states he doesn't have a PCP and cannot get one right now.  Pt states he has treatments, but his nebulizer is broken.  Pt speaking in clear sentences and in NAD.  Respirations equal and unlabored.

## 2013-04-17 NOTE — ED Provider Notes (Signed)
CSN: [id number]     Arrival date & time 04/17/13  1333 History  This chart was scribed for non-physician practitioner Santiago Glad, PA-C working with Celene Kras, MD by Dorothey Baseman, ED Scribe. This patient was seen in room TR05C/TR05C and the patient's care was started at 2:42 PM.    Chief Complaint  Patient presents with  . Medication Refill   The history is provided by the patient. No language interpreter was used.   HPI Comments: Henry Schroeder is a 28 y.o. Male with a history of asthma who presents to the Emergency Department requesting a refill of his albuterol inhaler that he ran out of 2 days ago. Patient is complaining of an intermittent dry cough with associated wheezes and chest tightness onset last night secondary to running out of his inhaler. He reports some associated mild congestion and rhinorrhea onset 4 days ago that has been improving. Patient was seen here on 03/17/2013 (1 month ago) for asthma exacerbation and was discharged with an albuterol inhaler, which he also stated he ran out of at that time. Patient states that he does have a nebulizer at home, but that it is broken. Patient states that he does not currently have a PCP and is unable to acquire one for financial reasons. He denies fever or chills. Patient has no other pertinent medical history. Patient reports that he recently quit smoking, about 2 months ago, but will still occasionally have a cigarette.  Past Medical History  Diagnosis Date  . Asthma    History reviewed. No pertinent past surgical history. No family history on file. History  Substance Use Topics  . Smoking status: Never Smoker   . Smokeless tobacco: Not on file  . Alcohol Use: No    Review of Systems  A complete 10 system review of systems was obtained and all systems are negative except as noted in the HPI and PMH.   Allergies  Shellfish allergy  Home Medications   Current Outpatient Rx  Name  Route  Sig  Dispense  Refill  . albuterol  (PROVENTIL HFA;VENTOLIN HFA) 108 (90 BASE) MCG/ACT inhaler   Inhalation   Inhale 1 puff into the lungs every 6 (six) hours as needed for wheezing or shortness of breath.          Marland Kitchen albuterol (PROVENTIL) (2.5 MG/3ML) 0.083% nebulizer solution   Nebulization   Take 3 mLs (2.5 mg total) by nebulization every 6 (six) hours as needed for wheezing.   75 mL   12   . predniSONE (DELTASONE) 20 MG tablet   Oral   Take 3 tablets (60 mg total) by mouth daily.   15 tablet   0    Triage Vitals: BP 111/71  Pulse 81  Temp(Src) 98.3 F (36.8 C) (Oral)  Resp 14  SpO2 98%  Physical Exam  Nursing note and vitals reviewed. Constitutional: He appears well-developed and well-nourished.  HENT:  Head: Normocephalic and atraumatic.  Right Ear: Hearing, tympanic membrane, external ear and ear canal normal.  Left Ear: Hearing, tympanic membrane, external ear and ear canal normal.  Mouth/Throat: Oropharynx is clear and moist.  Eyes: EOM are normal. Pupils are equal, round, and reactive to light.  Neck: Normal range of motion. Neck supple.  Cardiovascular: Normal rate, regular rhythm and normal heart sounds.   Pulmonary/Chest: Effort normal. He has wheezes.  Mild, diffuse, late inspiratory wheeze.   Musculoskeletal: Normal range of motion.  Neurological: He is alert.  Skin: Skin is warm and  dry.  Psychiatric: He has a normal mood and affect. His behavior is normal.    ED Course  Procedures (including critical care time)  DIAGNOSTIC STUDIES: Oxygen Saturation is 98% on room air, normal by my interpretation.    COORDINATION OF CARE: 2:47 PM- Will order a breathing treatment. Will discharge patient with an albuterol inhaler. Discussed treatment plan with patient at bedside and patient verbalized agreement.     Labs Review Labs Reviewed - No data to display Imaging Review No results found.  EKG Interpretation   None      3:41 PM Reassessed patient after getting breathing treatment.   He reports that his breathing is much improved.  Lungs CTAB. MDM  No diagnosis found. Patient presenting today requesting an Albuterol inhaler.  He reports that he has had a cough, wheezing, and SOB since last evening.  On exam initially patient with very mild wheezing.  No signs of respiratory distress.  Patient given breathing treatment and symptoms improved.  Patient stable for discharge.  Patient discharged home with Albuterol inhaler.  Return precautions given.    Santiago GladHeather Makani Seckman, PA-C 04/18/13 1421

## 2013-04-17 NOTE — Discharge Instructions (Signed)
Read the instructions below on reasons to return to the emergency department and to learn more about your diagnosis.  Use over the counter medications for symptomatic relief as we discussed (mucinex as a decongestant, Tylenol for fever/pain, Motrin/Ibuprofen for muscle aches).   Upper Respiratory Infection, Adult  An upper respiratory infection (URI) is also sometimes known as the common cold. Most people improve within 1 week, but symptoms can last up to 2 weeks. A residual cough may last even longer.   URI is most commonly caused by a virus. Viruses are NOT treated with antibiotics. You can easily spread the virus to others by oral contact. This includes kissing, sharing a glass, coughing, or sneezing. Touching your mouth or nose and then touching a surface, which is then touched by another person, can also spread the virus.   TREATMENT  Treatment is directed at relieving symptoms. There is no cure. Antibiotics are not effective, because the infection is caused by a virus, not by bacteria. Treatment may include:  Increased fluid intake. Sports drinks offer valuable electrolytes, sugars, and fluids.  Breathing heated mist or steam (vaporizer or shower).  Eating chicken soup or other clear broths, and maintaining good nutrition.  Getting plenty of rest.  Using gargles or lozenges for comfort.  Controlling fevers with ibuprofen or acetaminophen as directed by your caregiver.  Increasing usage of your inhaler if you have asthma.  Return to work when your temperature has returned to normal.   SEEK MEDICAL CARE IF:  After the first few days, you feel you are getting worse rather than better.  You develop worsening shortness of breath, or brown or red sputum. These may be signs of pneumonia.  You develop yellow or brown nasal discharge or pain in the face, especially when you bend forward. These may be signs of sinusitis.  You develop a fever, swollen neck glands, pain with swallowing, or white  areas in the back of your throat. These may be signs of strep throat.     Emergency Department Resource Guide 1) Find a Doctor and Pay Out of Pocket Although you won't have to find out who is covered by your insurance plan, it is a good idea to ask around and get recommendations. You will then need to call the office and see if the doctor you have chosen will accept you as a new patient and what types of options they offer for patients who are self-pay. Some doctors offer discounts or will set up payment plans for their patients who do not have insurance, but you will need to ask so you aren't surprised when you get to your appointment.  2) Contact Your Local Health Department Not all health departments have doctors that can see patients for sick visits, but many do, so it is worth a call to see if yours does. If you don't know where your local health department is, you can check in your phone book. The CDC also has a tool to help you locate your state's health department, and many state websites also have listings of all of their local health departments.  3) Find a Walk-in Clinic If your illness is not likely to be very severe or complicated, you may want to try a walk in clinic. These are popping up all over the country in pharmacies, drugstores, and shopping centers. They're usually staffed by nurse practitioners or physician assistants that have been trained to treat common illnesses and complaints. They're usually fairly quick and inexpensive. However, if  you have serious medical issues or chronic medical problems, these are probably not your best option.  No Primary Care Doctor: - Call Health Connect at  [phone] - they can help you locate a primary care doctor that  accepts your insurance, provides certain services, etc. - Physician Referral Service- [phone]  Chronic Pain Problems: Organization         Address  Phone   Notes  [facility]  [phone] Patients  need to be referred by their primary care doctor.   Medication Assistance: Organization         Address  Phone   Notes  [facility] [address] [place], [place] [id number] [phone]) [phone] --Must be a resident of [facility] [place] -- Must have NO insurance coverage whatsoever (no Medicaid/ Medicare, etc.) -- The pt. MUST have a primary care doctor that directs their care regularly and follows them in the community   [facility]ist  [phone]   [employer]  [phone]    Agencies that provide inexpensive medical care: Organization         Address  Phone   Notes  [facility]  [phone]) [phone]   [facility]    [phone]) [phone]   [facility] [address] [place], [place] [address] [phone]) [phone]   [facility] [address], [place] [phone]   [facility]    [phone](336) [phone]   [facility]    [phone]   [facility]  [address], [place] Phone:  [phone], Fax:  [phone] Hours of Operation:  9 am - 6 pm, M-F.  Also accepts Medicaid/Medicare and self-pay.  [facility]  [address], [place] Phone: [phone], Fax: [phone]. Hours of Operation:  8:30 am - 5:30 pm, M-F.  Also accepts Medicaid and self-pay.  [facility] [place] [address], [place] Phone: [phone]) [phone]   [facility] [address], [place], [place] [phone], Ext. [phone] Mondays & Thursdays: 7-9 AM.  First 15 patients are seen on a first come, first serve basis.    Medicaid-accepting [facility] Providers:  Organization         Address  Phone   Notes  [facility] [address] [place] [address], [place] [phone] Also accepts self-pay patients.  [facility] [address], [place]  [phone](336) [phone]     [facility] [address], [place] [phone]) [phone]   [facility] [address] [place] [address], [place] [phone]) [phone]   Renaye RakersVeita Bland [address], [place]   [phone]) [phone] Only accepts [place] Access [place] patients after they have their name applied to their card.   Self-Pay (no insurance) in [facility] [place]:  Organization         Address  Phone   Notes  Sickle Cell Patients, [facility] [address], [place] [phone]) [phone]   [facility] [address], [place] [phone]) [phone]   [facility]  [address], [place] [phone]) [phone]   [facility]/Dr. Osei-Bonsu  [address] [place] [address], [place] or [phone], [place] [phone] Phone number for both [facility]  and  locations is the same.  [facility] [address] [place] [phone]   [facility] [address], [place] or [address] [phone]   [facility] [address], [place] [phone], phone; [phone], fax Sees patients 1st and 3rd Saturday of every month.  Must not qualify for public or private insurance (i.e. Medicaid, Medicare, [place] Health Choice, Veterans' Benefits)  Household income should be no more than 200% of the poverty level The clinic cannot treat you if you are pregnant or think you are pregnant  Sexually transmitted diseases are not treated at the clinic.    Dental Care: Organization         Address  Phone  Notes  [facility] [address] [place], [place] [phone] Accepts children up to age 40 who are enrolled in [place] or [place] Health Choice; pregnant women with a Medicaid card; and children who have applied for Medicaid or [place] Health Choice, but were declined,  whose parents can pay a reduced fee at time of service.  [facility] of [facility]  [address], [place] [phone] Accepts children up to age 50 who are enrolled in [place] or [place] Health Choice; pregnant women with a Medicaid card; and children who have applied for Medicaid or [place] Health Choice, but were declined, whose parents can pay a reduced fee at time of service.  [facility]  [address] [place], [place] [phone] Patients are seen by appointment only. Walk-ins are not accepted. [facility] will see patients 55 years of age and older. Monday - Tuesday (8am-5pm) Most Wednesdays (8:30-5pm) $30 per visit, cash only  [facility] Adult Dental Access PROGRAM  [address], [facility] [phone] Patients are seen by appointment only. Walk-ins are not accepted. [facility] will see patients 41 years of age and older. One Wednesday Evening (Monthly: Volunteer Based).  $30 per visit, cash only  [employer]  [phone] for adults; Children under age 35, call Graduate Pediatric Dentistry at [phone]. Children aged 88-14, please call [phone] to request a pediatric application.  Dental services are provided in all areas of dental care including fillings, crowns and bridges, complete and partial dentures, implants, gum treatment, root canals, and extractions. Preventive care is also provided. Treatment is provided to both adults and children. Patients are selected via a lottery and there is often a waiting list.   [facility] [address], [place]  [phone] [email or web address]   [facility] [address] [place], [place] [phone], Ext. [phone] Second and Fourth Thursday of each month, opens at 6:30 AM; Clinic ends at 9 AM.  Patients are seen on a first-come first-served basis, and a limited number are seen during each clinic.   [facility]  [address] [place], [place] [phone]   Eligibility Requirements You must have lived in [place], [place], or [place], [place] counties for at least the last three months.   You cannot be eligible for state or federal sponsored [place], including [employer], [place], or [employer].   You generally cannot be eligible for healthcare insurance through your employer.    How to apply: Eligibility screenings are held every Tuesday and Wednesday afternoon from 1:00 pm until 4:00 pm. You do  not need an appointment for the interview!  [facility] [address], [place], [place] [phone]   [facility] Health Department  [phone]   [facility] Health Department  [phone]   [facility]  [phone]    Behavioral Health Resources in the Community: Intensive Outpatient Programs Organization         Address  Phone  Notes  [facility] Services [address] [place], [place] [phone]   [facility] Outpatient [address] [place], [place] [phone]   [facility] [address], [place], [place]  [phone]   [facility] [address]  [place], [place] [phone] or [phone]   Substance Abuse Resources Organization         Address  Phone  Notes  [facility] Drug Services  [phone]   [facility]  [phone]   [facility] [place]  [phone]   Floydene Flock  [phone]   Residential & Outpatient Substance Abuse Program  [phone]   Psychological Services Organization         Address  Phone  Notes  [facility] Behavioral Health  [phone]   [facility]  [phone]   [facility] [address], [place] [phone] or [phone]    Mobile Crisis Teams Organization         Address  Phone  Notes  [facility], Mobile Crisis Care Unit   [phone]   [facility]  [address] [place], [place] [phone]   Doristine Locks [address] [place] [phone]    Self-Help/Support Groups Organization         Address  Phone             Notes  [facility] [place] - variety of support groups  [phone]- I7437963 Call for more information  Narcotics Anonymous (NA), [facility] [address], [employer] [place]  2 meetings at this location   Statistician         Address  Phone  Notes  [facility] [address],    [place]  [phone]   [facility]  [address] [place] [address], [place], [place] [phone]   [facility] [address] [place], [place] [phone] Admissions: 8am-3pm M-F  [facility] [address]    [place], [place] [phone]   [facility] [address] [place], [place], [place] [phone]   [facility] [address],  [place], [place] [phone]   [facility] - Intensive Outpatient [address], [place], [place] [phone]   [facility] ([facility] [address] [place]  [place], [place] [phone] or [phone]   [facility] ([facility]) [address] [address], [place], [place] [phone] Accepts Medicaid  [facility] [address],  [place] [phone] Substance Abuse/Addiction Treatment   [facility] Organization         Address  Phone  Notes  [facility]  [phone]   Angie Fava, PhD [address] [place], [place]   [phone] or [phone]   [facility] Behavioral   [address] [place], [place] [phone]   [facility] [address] [place], [place] [phone] Insurance/Medicaid/sponsorship through [employer] and Families [address]  [place], [place] [phone] [place], [place] [phone]    Dr. Adele Schilder  [phone]   [facility] Dept. 1) [address], [place] 2) [place] 3)  [place] [address], [place] [phone]  [phone]   [place] [phone] or [phone] (After Hours)

## 2013-04-23 NOTE — ED Provider Notes (Signed)
Medical screening examination/treatment/procedure(s) were performed by non-physician practitioner and as supervising physician I was immediately available for consultation/collaboration.    Correll Denbow R Ellis Mehaffey, MD 04/23/13 0702 

## 2013-06-24 ENCOUNTER — Emergency Department (HOSPITAL_COMMUNITY)
Admission: EM | Admit: 2013-06-24 | Discharge: 2013-06-24 | Disposition: A | Discharge status: Home / Self Care | Payer: Self-pay | Attending: Emergency Medicine | Admitting: Emergency Medicine

## 2013-06-24 ENCOUNTER — Encounter (HOSPITAL_COMMUNITY): Payer: Self-pay | Admitting: Emergency Medicine

## 2013-06-24 DIAGNOSIS — Z87891 Personal history of nicotine dependence: Secondary | ICD-10-CM | POA: Insufficient documentation

## 2013-06-24 DIAGNOSIS — Z79899 Other long term (current) drug therapy: Secondary | ICD-10-CM | POA: Insufficient documentation

## 2013-06-24 DIAGNOSIS — Z760 Encounter for issue of repeat prescription: Secondary | ICD-10-CM | POA: Insufficient documentation

## 2013-06-24 MED ORDER — ALBUTEROL SULFATE HFA 108 (90 BASE) MCG/ACT IN AERS: 1.0000 | INHALATION_SPRAY | Freq: Four times a day (QID) | RESPIRATORY_TRACT | Status: DC | PRN

## 2013-06-24 MED ORDER — ALBUTEROL SULFATE (2.5 MG/3ML) 0.083% IN NEBU: 5.0000 mg | INHALATION_SOLUTION | Freq: Four times a day (QID) | RESPIRATORY_TRACT | Status: DC | PRN

## 2013-06-24 MED ORDER — ALBUTEROL SULFATE HFA 108 (90 BASE) MCG/ACT IN AERS
2.0000 | INHALATION_SPRAY | Freq: Once | RESPIRATORY_TRACT | Status: AC
  Administered 2013-06-24: 2 via RESPIRATORY_TRACT
  Filled 2013-06-24: qty 6.7

## 2013-06-24 NOTE — ED Provider Notes (Signed)
CSN: [id number]     Arrival date & time 06/24/13  1336 History  This chart was scribed for non-physician practitioner, Sharilyn SitesLisa Sanders, PA-C working with Dagmar HaitWilliam Blair Walden, MD by Greggory StallionKayla Andersen, ED scribe. This patient was seen in room TR08C/TR08C and the patient's care was started at 3:30 PM.   Chief Complaint  Patient presents with  . Asthma   The history is provided by the patient. No language interpreter was used.   HPI Comments: Henry Schroeder is a 28 y.o. male who presents to the Emergency Department complaining of a medication refill. Pt states he has been out of his albuterol inhaler for 3 days. He does not have any current symptoms but states his asthma sometimes flares up at work because he works around dust. Denies fever, chills, cough, or other URI sx.  Past Medical History  Diagnosis Date  . Asthma    History reviewed. No pertinent past surgical history. History reviewed. No pertinent family history. History  Substance Use Topics  . Smoking status: Former Smoker    Quit date: 02/15/2013  . Smokeless tobacco: Not on file  . Alcohol Use: No    Review of Systems  Constitutional: Negative for fever.       Medication refill  All other systems reviewed and are negative.   Allergies  Shellfish allergy  Home Medications   Current Outpatient Rx  Name  Route  Sig  Dispense  Refill  . albuterol (PROVENTIL HFA;VENTOLIN HFA) 108 (90 BASE) MCG/ACT inhaler   Inhalation   Inhale 2 puffs into the lungs every 6 (six) hours as needed for wheezing or shortness of breath.          Marland Kitchen. albuterol (PROVENTIL) (2.5 MG/3ML) 0.083% nebulizer solution   Nebulization   Take 3 mLs (2.5 mg total) by nebulization every 6 (six) hours as needed for wheezing.   75 mL   12    BP 118/77  Pulse 91  Temp(Src) 98.2 F (36.8 C) (Oral)  Resp 18  Ht 5\' 8"  (1.727 m)  Wt 178 lb 11.2 oz (81.058 kg)  BMI 27.18 kg/m2  SpO2 95%  Physical Exam  Nursing note and vitals reviewed. Constitutional:  He is oriented to person, place, and time. He appears well-developed and well-nourished. No distress.  HENT:  Head: Normocephalic and atraumatic.  Mouth/Throat: Oropharynx is clear and moist.  Eyes: Conjunctivae and EOM are normal. Pupils are equal, round, and reactive to light.  Neck: Normal range of motion. Neck supple.  Cardiovascular: Normal rate, regular rhythm and normal heart sounds.   Pulmonary/Chest: Effort normal and breath sounds normal. No accessory muscle usage. Not tachypneic. No respiratory distress. He has no decreased breath sounds. He has no wheezes. He has no rhonchi.  Musculoskeletal: Normal range of motion. He exhibits no edema.  Neurological: He is alert and oriented to person, place, and time.  Skin: Skin is warm and dry. He is not diaphoretic.  Psychiatric: He has a normal mood and affect.    ED Course  Procedures (including critical care time)  DIAGNOSTIC STUDIES: Oxygen Saturation is 95% on RA, adequate by my interpretation.    COORDINATION OF CARE: 3:31 PM-Discussed treatment plan which includes refilling asthma medications with pt at bedside and pt agreed to plan.   Labs Review Labs Reviewed - No data to display Imaging Review No results found.   EKG Interpretation None      MDM   Final diagnoses:  Medication refill   Pt with hx of  asthma, no current sx, VS stable.  Pt given refill of inhaler and neb soln.  Discussed plan with pt, they agreed.  Return precautions advised.  I personally performed the services described in this documentation, which was scribed in my presence. The recorded information has been reviewed and is accurate.  Garlon Hatchet, PA-C 06/24/13 1601

## 2013-06-24 NOTE — ED Notes (Signed)
Pt reports that he has a hx of asthma and is out of his medication. States that he needs his inhaler refilled.

## 2013-06-24 NOTE — Discharge Instructions (Signed)
Take the prescribed medication as directed. °Return to the ED for new or worsening symptoms. ° °

## 2013-06-24 NOTE — ED Provider Notes (Signed)
Medical screening examination/treatment/procedure(s) were performed by non-physician practitioner and as supervising physician I was immediately available for consultation/collaboration.   EKG Interpretation None        William Gabreal Worton, MD 06/24/13 1608 

## 2013-07-22 ENCOUNTER — Encounter (HOSPITAL_COMMUNITY): Payer: Self-pay | Admitting: Emergency Medicine

## 2013-07-22 ENCOUNTER — Emergency Department (HOSPITAL_COMMUNITY)
Admission: EM | Admit: 2013-07-22 | Discharge: 2013-07-22 | Disposition: A | Discharge status: Home / Self Care | Payer: Self-pay | Attending: Emergency Medicine | Admitting: Emergency Medicine

## 2013-07-22 DIAGNOSIS — R05 Cough: Secondary | ICD-10-CM | POA: Insufficient documentation

## 2013-07-22 DIAGNOSIS — R059 Cough, unspecified: Secondary | ICD-10-CM | POA: Insufficient documentation

## 2013-07-22 DIAGNOSIS — J45901 Unspecified asthma with (acute) exacerbation: Secondary | ICD-10-CM | POA: Insufficient documentation

## 2013-07-22 DIAGNOSIS — R0602 Shortness of breath: Secondary | ICD-10-CM | POA: Insufficient documentation

## 2013-07-22 DIAGNOSIS — Z87891 Personal history of nicotine dependence: Secondary | ICD-10-CM | POA: Insufficient documentation

## 2013-07-22 MED ORDER — ALBUTEROL SULFATE HFA 108 (90 BASE) MCG/ACT IN AERS
2.0000 | INHALATION_SPRAY | Freq: Once | RESPIRATORY_TRACT | Status: AC
  Administered 2013-07-22: 2 via RESPIRATORY_TRACT
  Filled 2013-07-22: qty 6.7

## 2013-07-22 MED ORDER — ALBUTEROL SULFATE (2.5 MG/3ML) 0.083% IN NEBU
2.5000 mg | INHALATION_SOLUTION | Freq: Once | RESPIRATORY_TRACT | Status: AC
  Administered 2013-07-22: 2.5 mg via RESPIRATORY_TRACT
  Filled 2013-07-22: qty 3

## 2013-07-22 MED ORDER — PREDNISONE 20 MG PO TABS: ORAL_TABLET | ORAL | Status: DC

## 2013-07-22 MED ORDER — IPRATROPIUM-ALBUTEROL 0.5-2.5 (3) MG/3ML IN SOLN
3.0000 mL | RESPIRATORY_TRACT | Status: DC
  Administered 2013-07-22: 3 mL via RESPIRATORY_TRACT
  Filled 2013-07-22 (×2): qty 3

## 2013-07-22 MED ORDER — PREDNISONE 20 MG PO TABS
60.0000 mg | ORAL_TABLET | Freq: Once | ORAL | Status: AC
  Administered 2013-07-22: 60 mg via ORAL
  Filled 2013-07-22: qty 3

## 2013-07-22 NOTE — ED Notes (Signed)
RT called for assitance

## 2013-07-22 NOTE — ED Notes (Signed)
Pt not in room, This nurse went to recheck vitals and ambulate, pt not in restroom or lobby.

## 2013-07-22 NOTE — ED Notes (Signed)
Pt presents with asthma exacerbation, pt ran out of his inhaler this am, pt reports difficulty breathing for several days. Audible wheezing noted in triage, O2 sats 87% RA did increase to 90% RA

## 2013-07-22 NOTE — Discharge Instructions (Signed)
Take prednisone as prescribed.   Use albuterol 2 puffs every 4 hrs for 2 days then as needed.   Follow up with your doctor.   Return to ER if you have worse shortness of breath, trouble breathing, wheezing.

## 2013-07-22 NOTE — ED Provider Notes (Signed)
CSN: [id number]     Arrival date & time 07/22/13  [id number] History   First MD Initiated Contact with Patient 07/22/13 [phone]     Chief Complaint  Patient presents with  . Asthma     (Consider location/radiation/quality/duration/timing/severity/associated sxs/prior Treatment) The history is provided by the patient.  Henry Schroeder is a 28 y.o. male hx of asthma here with cough. Cough and shortness of breath since last night. He states that he heard himself wheeze as well. He ran out of albuterol last night. Denies fever or chills. Has several asthma exacerbations a year but no hospitalizations. No history of PE or DVT. Noted to be hypoxic 87% on RA in triage.    Past Medical History  Diagnosis Date  . Asthma    History reviewed. No pertinent past surgical history. History reviewed. No pertinent family history. History  Substance Use Topics  . Smoking status: Former Smoker    Quit date: 02/15/2013  . Smokeless tobacco: Not on file  . Alcohol Use: No    Review of Systems  Respiratory: Positive for shortness of breath and wheezing.   All other systems reviewed and are negative.     Allergies  Shellfish allergy and Onion  Home Medications   Prior to Admission medications   Medication Sig Start Date End Date Taking? Authorizing Provider  albuterol (PROVENTIL HFA;VENTOLIN HFA) 108 (90 BASE) MCG/ACT inhaler Inhale 2 puffs into the lungs every 6 (six) hours as needed for wheezing or shortness of breath.     Historical Provider, MD  albuterol (PROVENTIL HFA;VENTOLIN HFA) 108 (90 BASE) MCG/ACT inhaler Inhale 1-2 puffs into the lungs every 6 (six) hours as needed for wheezing. 06/24/13   Garlon HatchetLisa M Sanders, PA-C  albuterol (PROVENTIL) (2.5 MG/3ML) 0.083% nebulizer solution Take 3 mLs (2.5 mg total) by nebulization every 6 (six) hours as needed for wheezing. 12/01/12   Tatyana A Kirichenko, PA-C  albuterol (PROVENTIL) (2.5 MG/3ML) 0.083% nebulizer solution Take 6 mLs (5 mg total) by nebulization  every 6 (six) hours as needed for wheezing or shortness of breath. 06/24/13   Garlon HatchetLisa M Sanders, PA-C   BP 116/77  Pulse 89  Temp(Src) 98.3 F (36.8 C) (Oral)  Resp 22  Wt 175 lb (79.379 kg)  SpO2 91% Physical Exam  Nursing note and vitals reviewed. Constitutional: He is oriented to person, place, and time.  Slightly tachypneic, talking in full sentences   HENT:  Head: Normocephalic.  Mouth/Throat: Oropharynx is clear and moist.  Eyes: Conjunctivae and EOM are normal. Pupils are equal, round, and reactive to light.  Neck: Normal range of motion. Neck supple.  Cardiovascular: Normal rate, regular rhythm and normal heart sounds.   Pulmonary/Chest:  Slightly tachypneic, talking in full sentences. + diffuse wheezing no retractions.   Abdominal: Soft. Bowel sounds are normal. He exhibits no distension. There is no tenderness. There is no rebound and no guarding.  Musculoskeletal: Normal range of motion. He exhibits no edema and no tenderness.  Neurological: He is alert and oriented to person, place, and time. No cranial nerve deficit. Coordination normal.  Skin: Skin is warm and dry.  Psychiatric: He has a normal mood and affect. His behavior is normal. Judgment and thought content normal.    ED Course  Procedures (including critical care time) Labs Review Labs Reviewed - No data to display  Imaging Review No results found.   EKG Interpretation None      MDM   Final diagnoses:  None    Henry Schroeder  is a 28 y.o. male here with wheezing, SOB. Will give nebs, steroids. Will reassess. I doubt PE or ACS or dissection, likely asthma exacerbation.   10:12 AM Not wheezing after 1st neb and steroids. Felt better. Will ambulate patient to make sure that he is not hypoxic. Will d/c home on albuterol, steroids if not hypoxic with ambulation.   10:45 AM Patient left without receiving d/c instructions. Got inhaler to go home.    Richardean Canalavid H Yao, MD 07/22/13 1046

## 2013-09-02 ENCOUNTER — Emergency Department (HOSPITAL_COMMUNITY)
Admission: EM | Admit: 2013-09-02 | Discharge: 2013-09-02 | Disposition: A | Discharge status: Home / Self Care | Payer: Self-pay | Attending: Emergency Medicine | Admitting: Emergency Medicine

## 2013-09-02 ENCOUNTER — Encounter (HOSPITAL_COMMUNITY): Payer: Self-pay | Admitting: Emergency Medicine

## 2013-09-02 DIAGNOSIS — Z79899 Other long term (current) drug therapy: Secondary | ICD-10-CM | POA: Insufficient documentation

## 2013-09-02 DIAGNOSIS — Z760 Encounter for issue of repeat prescription: Secondary | ICD-10-CM | POA: Insufficient documentation

## 2013-09-02 DIAGNOSIS — Z91018 Allergy to other foods: Secondary | ICD-10-CM | POA: Insufficient documentation

## 2013-09-02 DIAGNOSIS — J45909 Unspecified asthma, uncomplicated: Secondary | ICD-10-CM | POA: Insufficient documentation

## 2013-09-02 DIAGNOSIS — Z87891 Personal history of nicotine dependence: Secondary | ICD-10-CM | POA: Insufficient documentation

## 2013-09-02 DIAGNOSIS — Z91013 Allergy to seafood: Secondary | ICD-10-CM | POA: Insufficient documentation

## 2013-09-02 MED ORDER — ALBUTEROL SULFATE HFA 108 (90 BASE) MCG/ACT IN AERS: 2.0000 | INHALATION_SPRAY | RESPIRATORY_TRACT | Status: DC | PRN

## 2013-09-02 MED ORDER — ALBUTEROL SULFATE HFA 108 (90 BASE) MCG/ACT IN AERS
2.0000 | INHALATION_SPRAY | Freq: Once | RESPIRATORY_TRACT | Status: AC
  Administered 2013-09-02: 2 via RESPIRATORY_TRACT
  Filled 2013-09-02: qty 6.7

## 2013-09-02 NOTE — ED Notes (Signed)
[facility] Liaison   I've spoke to the patient in prior visits to the ED and provided him with resources.  I was not able to see patient this ED visit [facility] orange card information and resource guide will be mailed to the address listed.

## 2013-09-02 NOTE — Discharge Instructions (Signed)
Please follow up with your primary care physician in 1-2 days. If you do not have one please call the [facility] number listed above. Please read all discharge instructions and return precautions.  ° ° °Medication Refill, Emergency Department °We have refilled your medication today as a courtesy to you. It is best for your medical care, however, to take care of getting refills done through your primary caregiver's office. They have your records and can do a better job of follow-up than we can in the emergency department. °On maintenance medications, we often only prescribe enough medications to get you by until you are able to see your regular caregiver. This is a more expensive way to refill medications. °In the future, please plan for refills so that you will not have to use the emergency department for this. °Thank you for your help. Your help allows us to better take care of the daily emergencies that enter our department. °Document Released: 07/01/2003 Document Revised: 06/06/2011 Document Reviewed: 03/14/2005 °Exit[employer]® Patient Information ©2014 [employer]. ° ° °

## 2013-09-02 NOTE — ED Notes (Signed)
Pt refused wc escort to lobby. Pt alert x4 respirations easy non labored

## 2013-09-02 NOTE — ED Notes (Signed)
Pt requesting refill on albuterol inhaler. Denies any symptoms at the time. Denies pain. Respirations unlabored. Pt is alert and oriented x4.

## 2013-09-02 NOTE — ED Provider Notes (Signed)
CSN: [id number]     Arrival date & time 09/02/13  1026 History  This chart was scribed for non-physician practitioner, Francee Piccolo, PA-C working with Derwood Kaplan, MD by Greggory Stallion, ED scribe. This patient was seen in room TR10C/TR10C and the patient's care was started at 11:24 AM.   Chief Complaint  Patient presents with  . Medication Refill   The history is provided by the patient. No language interpreter was used.   HPI Comments: Henry Schroeder is a 28 y.o. male who presents to the Emergency Department requesting a refill on his albuterol inhaler. States he had wheezing and cough earlier today but used the last of his inhaler with relief. Denies fever, chills, chest tightness. Denies recent illness. Denies past hospitalization for his asthma.   Past Medical History  Diagnosis Date  . Asthma    History reviewed. No pertinent past surgical history. History reviewed. No pertinent family history. History  Substance Use Topics  . Smoking status: Former Smoker    Quit date: 02/15/2013  . Smokeless tobacco: Not on file  . Alcohol Use: No    Review of Systems  Constitutional: Negative for fever and chills.  Respiratory: Negative for cough, chest tightness, shortness of breath and wheezing.   All other systems reviewed and are negative.  Allergies  Shellfish allergy and Onion  Home Medications   Prior to Admission medications   Medication Sig Start Date End Date Taking? Authorizing Provider  albuterol (PROVENTIL HFA;VENTOLIN HFA) 108 (90 BASE) MCG/ACT inhaler Inhale 2 puffs into the lungs every 6 (six) hours as needed for wheezing or shortness of breath.     Historical Provider, MD  albuterol (PROVENTIL HFA;VENTOLIN HFA) 108 (90 BASE) MCG/ACT inhaler Inhale 2 puffs into the lungs every 4 (four) hours as needed for wheezing or shortness of breath. 09/02/13   Jakeline Dave L Taneasha Fuqua, PA-C  albuterol (PROVENTIL) (2.5 MG/3ML) 0.083% nebulizer solution Take 3 mLs (2.5 mg total)  by nebulization every 6 (six) hours as needed for wheezing. 12/01/12   Tatyana A Kirichenko, PA-C  predniSONE (DELTASONE) 20 MG tablet Take 60 mg daily for 2 days then 40 mg daily for 2 days then 20 mg daily for 2 days then stop 07/22/13   Richardean Canal, MD   BP 111/74  Pulse 70  Temp(Src) 98.1 F (36.7 C) (Oral)  Resp 18  Wt 175 lb (79.379 kg)  SpO2 95%  Physical Exam  Nursing note and vitals reviewed. Constitutional: He is oriented to person, place, and time. He appears well-developed and well-nourished. No distress.  HENT:  Head: Normocephalic and atraumatic.  Right Ear: External ear normal.  Left Ear: External ear normal.  Nose: Nose normal.  Mouth/Throat: Oropharynx is clear and moist.  Eyes: Conjunctivae are normal.  Neck: Normal range of motion. Neck supple.  Cardiovascular: Normal rate, regular rhythm and normal heart sounds.   Pulmonary/Chest: Effort normal and breath sounds normal. No respiratory distress. He has no wheezes. He has no rales.  Abdominal: Soft.  Musculoskeletal: Normal range of motion.  Neurological: He is alert and oriented to person, place, and time.  Skin: Skin is warm and dry. He is not diaphoretic.  Psychiatric: He has a normal mood and affect.    ED Course  Procedures (including critical care time) Medications  albuterol (PROVENTIL HFA;VENTOLIN HFA) 108 (90 BASE) MCG/ACT inhaler 2 puff (2 puffs Inhalation Given 09/02/13 1143)    DIAGNOSTIC STUDIES: Oxygen Saturation is 95% on RA, adequate by my interpretation.  COORDINATION OF CARE: 11:25 AM-Discussed treatment plan which includes refilling inhaler with pt at bedside and pt agreed to plan.   Labs Review Labs Reviewed - No data to display  Imaging Review No results found.   EKG Interpretation None      MDM   Final diagnoses:  Medication refill    Filed Vitals:   09/02/13 1042  BP: 111/74  Pulse: 70  Temp: 98.1 F (36.7 C)  Resp: 18   Afebrile, NAD, non-toxic appearing,  AAOx4.  No signs of respiratory distress. Lungs CTA bilaterally. Inhaler given in ED. Refill provided. Return precautions discussed. Patient is stable at time of discharge   I personally performed the services described in this documentation, which was scribed in my presence. The recorded information has been reviewed and is accurate.  Jeannetta EllisJennifer L Karena Kinker, PA-C 09/02/13 1147

## 2013-09-05 NOTE — ED Provider Notes (Signed)
Medical screening examination/treatment/procedure(s) were performed by non-physician practitioner and as supervising physician I was immediately available for consultation/collaboration.   EKG Interpretation None       Shanora Christensen, MD 09/05/13 0138 

## 2014-05-16 ENCOUNTER — Encounter (HOSPITAL_COMMUNITY): Payer: Self-pay | Admitting: *Deleted

## 2014-05-16 ENCOUNTER — Emergency Department (HOSPITAL_COMMUNITY)
Admission: EM | Admit: 2014-05-16 | Discharge: 2014-05-17 | Disposition: A | Discharge status: Home / Self Care | Payer: Self-pay | Attending: Emergency Medicine | Admitting: Emergency Medicine

## 2014-05-16 DIAGNOSIS — J45909 Unspecified asthma, uncomplicated: Secondary | ICD-10-CM | POA: Insufficient documentation

## 2014-05-16 DIAGNOSIS — Z87891 Personal history of nicotine dependence: Secondary | ICD-10-CM | POA: Insufficient documentation

## 2014-05-16 DIAGNOSIS — Z760 Encounter for issue of repeat prescription: Secondary | ICD-10-CM | POA: Insufficient documentation

## 2014-05-16 MED ORDER — ALBUTEROL SULFATE HFA 108 (90 BASE) MCG/ACT IN AERS
2.0000 | INHALATION_SPRAY | RESPIRATORY_TRACT | Status: DC | PRN
  Administered 2014-05-17: 2 via RESPIRATORY_TRACT
  Filled 2014-05-16 (×2): qty 6.7

## 2014-05-16 MED ORDER — ALBUTEROL SULFATE HFA 108 (90 BASE) MCG/ACT IN AERS: 1.0000 | INHALATION_SPRAY | Freq: Four times a day (QID) | RESPIRATORY_TRACT | Status: DC | PRN

## 2014-05-16 NOTE — ED Provider Notes (Signed)
CSN: [id number]     Arrival date & time 05/16/14  2334 History  This chart was scribed for non-physician practitioner, Sharilyn SitesLisa Clarrissa Shimkus, PA-C, working with Loren Raceravid Yelverton, MD, by Bronson CurbJacqueline Melvin, ED Scribe. This patient was seen in room TR05C/TR05C and the patient's care was started at 11:44 PM.   Chief Complaint  Patient presents with  . Asthma    The history is provided by the patient. No language interpreter was used.     HPI Comments: Henry Schroeder is a 29 y.o. male, with history of asthma, who presents to the Emergency Department for exacerbation of asthma for the past 2 hours. 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 He states his symptoms have resolved at this time, however, he ran out of his Pro-Air inhaler. He denies chest pain, wheezing, difficulty breathing, or SOB at this time.  No noted fever or chills.  VSS on arrival.   Past Medical History  Diagnosis Date  . Asthma    History reviewed. No pertinent past surgical history. No family history on file. History  Substance Use Topics  . Smoking status: Former Smoker    Quit date: 02/15/2013  . Smokeless tobacco: Not on file  . Alcohol Use: No    Review of Systems  Respiratory: Positive for wheezing (resolved).   All other systems reviewed and are negative.     Allergies  Shellfish allergy and Onion  Home Medications   Prior to Admission medications   Medication Sig Start Date End Date Taking? Authorizing Provider  albuterol (PROVENTIL HFA;VENTOLIN HFA) 108 (90 BASE) MCG/ACT inhaler Inhale 2 puffs into the lungs every 6 (six) hours as needed for wheezing or shortness of breath.     Historical Provider, MD  albuterol (PROVENTIL HFA;VENTOLIN HFA) 108 (90 BASE) MCG/ACT inhaler Inhale 2 puffs into the lungs every 4 (four) hours as needed for wheezing or shortness of breath. 09/02/13   Jennifer L Piepenbrink, PA-C  albuterol (PROVENTIL) (2.5  MG/3ML) 0.083% nebulizer solution Take 3 mLs (2.5 mg total) by nebulization every 6 (six) hours as needed for wheezing. 12/01/12   Tatyana A Kirichenko, PA-C  predniSONE (DELTASONE) 20 MG tablet Take 60 mg daily for 2 days then 40 mg daily for 2 days then 20 mg daily for 2 days then stop 07/22/13   Richardean Canalavid H Yao, MD   Triage Vitals: BP 119/80 mmHg  Pulse 68  Temp(Src) 98.2 F (36.8 C) (Oral)  Resp 16  Ht 5\' 8"  (1.727 m)  Wt 186 lb (84.369 kg)  BMI 28.29 kg/m2  SpO2 96%  Physical Exam  Constitutional: He is oriented to person, place, and time. He appears well-developed and well-nourished.  HENT:  Head: Normocephalic and atraumatic.  Mouth/Throat: Oropharynx is clear and moist.  Eyes: Conjunctivae and EOM are normal. Pupils are equal, round, and reactive to light.  Neck: Normal range of motion.  Cardiovascular: Normal rate, regular rhythm and normal heart sounds.   Pulmonary/Chest: Effort normal and breath sounds normal. No respiratory distress. He has no wheezes.  Respirations unlabored, no audible wheezes, speaking in full complete sentences without difficulty  Abdominal: Soft. Bowel sounds are normal.  Musculoskeletal: Normal range of motion.  Neurological: He is alert and oriented to person, place, and time.  Skin: Skin is warm and dry.  Psychiatric: He has a normal mood and affect.  Nursing note and vitals reviewed.   ED Course  Procedures (including critical  care time)  DIAGNOSTIC STUDIES: Oxygen Saturation is 96% on room air, adequate by my interpretation.    COORDINATION OF CARE: At 2345 Discussed treatment plan with patient which includes albuterol. Patient agrees.   Labs Review Labs Reviewed - No data to display  Imaging Review No results found.   EKG Interpretation None      MDM   Final diagnoses:  Medication refill   29 year old male requesting refill of inhaler after he ran out of his pro-air 2 hours ago. He does work in a cold and dusty environment which  often flares up his asthma. He currently denies any chest pain or shortness of breath. On exam, patient no acute distress. His respirations are unlabored and his lungs are clear bilaterally. He was given an inhaler in the emergency room as well as a prescription for her refill. He is discharged home and cleared to return to work.  Discussed plan with patient, he/she acknowledged understanding and agreed with plan of care.  Return precautions given for new or worsening symptoms.  I personally performed the services described in this documentation, which was scribed in my presence. The recorded information has been reviewed and is accurate.  Garlon Hatchet, PA-C 05/17/14 0006  Loren Racer, MD 05/17/14 [phone]

## 2014-05-16 NOTE — ED Notes (Signed)
The pt has asthma  And for the past 2 hours  He ran out of his inhaler.  noi resp diffiuclty no wheezes heard.

## 2014-05-16 NOTE — Discharge Instructions (Signed)
Take the prescribed medication as directed for wheezing/SOB. Return to the ED for new or worsening symptoms.

## 2014-07-14 DIAGNOSIS — Z79899 Other long term (current) drug therapy: Secondary | ICD-10-CM | POA: Insufficient documentation

## 2014-07-14 DIAGNOSIS — J45901 Unspecified asthma with (acute) exacerbation: Secondary | ICD-10-CM | POA: Insufficient documentation

## 2014-07-14 DIAGNOSIS — Z87891 Personal history of nicotine dependence: Secondary | ICD-10-CM | POA: Insufficient documentation

## 2014-07-14 DIAGNOSIS — Z7952 Long term (current) use of systemic steroids: Secondary | ICD-10-CM | POA: Insufficient documentation

## 2014-07-15 ENCOUNTER — Emergency Department (HOSPITAL_COMMUNITY)
Admission: EM | Admit: 2014-07-15 | Discharge: 2014-07-15 | Disposition: A | Discharge status: Home / Self Care | Payer: Self-pay | Attending: Emergency Medicine | Admitting: Emergency Medicine

## 2014-07-15 ENCOUNTER — Encounter (HOSPITAL_COMMUNITY): Payer: Self-pay | Admitting: Emergency Medicine

## 2014-07-15 ENCOUNTER — Emergency Department (HOSPITAL_COMMUNITY): Payer: Self-pay

## 2014-07-15 DIAGNOSIS — J45901 Unspecified asthma with (acute) exacerbation: Secondary | ICD-10-CM

## 2014-07-15 DIAGNOSIS — R0602 Shortness of breath: Secondary | ICD-10-CM

## 2014-07-15 DIAGNOSIS — J45909 Unspecified asthma, uncomplicated: Secondary | ICD-10-CM

## 2014-07-15 MED ORDER — ALBUTEROL SULFATE (2.5 MG/3ML) 0.083% IN NEBU
5.0000 mg | INHALATION_SOLUTION | Freq: Once | RESPIRATORY_TRACT | Status: AC
  Administered 2014-07-15: 5 mg via RESPIRATORY_TRACT
  Filled 2014-07-15: qty 6

## 2014-07-15 MED ORDER — ALBUTEROL SULFATE HFA 108 (90 BASE) MCG/ACT IN AERS
1.0000 | INHALATION_SPRAY | RESPIRATORY_TRACT | Status: DC | PRN
  Administered 2014-07-15: 2 via RESPIRATORY_TRACT
  Filled 2014-07-15: qty 6.7

## 2014-07-15 MED ORDER — PREDNISONE 20 MG PO TABS
60.0000 mg | ORAL_TABLET | Freq: Once | ORAL | Status: AC
  Administered 2014-07-15: 60 mg via ORAL
  Filled 2014-07-15: qty 3

## 2014-07-15 MED ORDER — PREDNISONE 20 MG PO TABS: ORAL_TABLET | ORAL | Status: DC

## 2014-07-15 NOTE — ED Notes (Signed)
Pt. reports asthma attack today with dry cough and wheezing , pt. ran out of his Proair inhaler today . Denies fever or chills.

## 2014-07-15 NOTE — Discharge Instructions (Signed)

## 2014-07-15 NOTE — ED Provider Notes (Signed)
CSN: [id number]     Arrival date & time 07/14/14  2333 History   First MD Initiated Contact with Patient 07/15/14 [phone]     Chief Complaint  Patient presents with  . Asthma     (Consider location/radiation/quality/duration/timing/severity/associated sxs/prior Treatment) HPI [facility] with history of asthma presents with worsening wheezing. Patient states that change seasons and pollen typically exacerbate his asthma. He recently ran out of his rescue inhaler. He is asking for refill. Received a nebulized albuterol treatment in triage with improvement of his symptoms. Denies fever or chills. Past Medical History  Diagnosis Date  . Asthma    History reviewed. No pertinent past surgical history. No family history on file. History  Substance Use Topics  . Smoking status: Former Smoker    Quit date: 02/15/2013  . Smokeless tobacco: Not on file  . Alcohol Use: No    Review of Systems  Constitutional: Negative for fever and chills.  HENT: Negative for congestion, sinus pressure and sore throat.   Respiratory: Positive for cough, shortness of breath and wheezing. Negative for chest tightness.   Cardiovascular: Negative for chest pain.  Gastrointestinal: Negative for nausea, vomiting, abdominal pain and diarrhea.  Musculoskeletal: Negative for myalgias, back pain, neck pain and neck stiffness.  Skin: Negative for rash and wound.  Neurological: Negative for dizziness, weakness, light-headedness, numbness and headaches.  All other systems reviewed and are negative.     Allergies  Shellfish allergy and Onion  Home Medications   Prior to Admission medications   Medication Sig Start Date End Date Taking? Authorizing Provider  albuterol (PROVENTIL HFA;VENTOLIN HFA) 108 (90 BASE) MCG/ACT inhaler Inhale 1-2 puffs into the lungs every 6 (six) hours as needed for wheezing. 05/16/14   Garlon HatchetLisa M Sanders, PA-C  predniSONE (DELTASONE) 20 MG tablet Take 60 mg daily for 2 days then 40 mg daily for 2  days then 20 mg daily for 2 days then stop 07/15/14   Loren Raceravid Calvina Liptak, MD   BP 116/73 mmHg  Pulse 70  Temp(Src) 97.8 F (36.6 C) (Oral)  Resp 14  Ht 5\' 8"  (1.727 m)  Wt 190 lb (86.183 kg)  BMI 28.90 kg/m2  SpO2 99% Physical Exam  Constitutional: He is oriented to person, place, and time. He appears well-developed and well-nourished. No distress.  HENT:  Head: Normocephalic and atraumatic.  Mouth/Throat: Oropharynx is clear and moist. No oropharyngeal exudate.  Eyes: EOM are normal. Pupils are equal, round, and reactive to light.  Neck: Normal range of motion. Neck supple.  Cardiovascular: Normal rate and regular rhythm.   Pulmonary/Chest: Effort normal. No respiratory distress. He has wheezes (few scattered expiratory wheezes.). He has no rales.  Abdominal: Soft. Bowel sounds are normal.  Musculoskeletal: Normal range of motion. He exhibits no edema or tenderness.  Neurological: He is alert and oriented to person, place, and time.  Skin: Skin is warm and dry. No rash noted. No erythema.  Psychiatric: He has a normal mood and affect. His behavior is normal.  Nursing note and vitals reviewed.   ED Course  Procedures (including critical care time) Labs Review Labs Reviewed - No data to display  Imaging Review No results found.   EKG Interpretation None      MDM   Final diagnoses:  Asthma exacerbation    No respiratory distress. Patient given albuterol inhaler in the emergency department. Given first dose of prednisone. I'll send home with short course. Return precautions given.    Loren Raceravid Dmetrius Ambs, MD 07/15/14 [phone]

## 2014-08-08 ENCOUNTER — Emergency Department (HOSPITAL_COMMUNITY)
Admission: EM | Admit: 2014-08-08 | Discharge: 2014-08-08 | Disposition: A | Discharge status: Home / Self Care | Payer: Self-pay | Attending: Emergency Medicine | Admitting: Emergency Medicine

## 2014-08-08 ENCOUNTER — Encounter (HOSPITAL_COMMUNITY): Payer: Self-pay | Admitting: *Deleted

## 2014-08-08 DIAGNOSIS — Z79899 Other long term (current) drug therapy: Secondary | ICD-10-CM | POA: Insufficient documentation

## 2014-08-08 DIAGNOSIS — Z87891 Personal history of nicotine dependence: Secondary | ICD-10-CM | POA: Insufficient documentation

## 2014-08-08 DIAGNOSIS — Z7952 Long term (current) use of systemic steroids: Secondary | ICD-10-CM | POA: Insufficient documentation

## 2014-08-08 DIAGNOSIS — J45901 Unspecified asthma with (acute) exacerbation: Secondary | ICD-10-CM | POA: Insufficient documentation

## 2014-08-08 MED ORDER — ALBUTEROL SULFATE HFA 108 (90 BASE) MCG/ACT IN AERS
2.0000 | INHALATION_SPRAY | RESPIRATORY_TRACT | Status: DC | PRN
  Administered 2014-08-08: 2 via RESPIRATORY_TRACT
  Filled 2014-08-08: qty 6.7

## 2014-08-08 MED ORDER — PREDNISONE 20 MG PO TABS
60.0000 mg | ORAL_TABLET | Freq: Once | ORAL | Status: AC
Start: 2014-08-08 — End: 2014-08-08
  Administered 2014-08-08: 60 mg via ORAL
  Filled 2014-08-08: qty 3

## 2014-08-08 MED ORDER — ALBUTEROL SULFATE HFA 108 (90 BASE) MCG/ACT IN AERS: 2.0000 | INHALATION_SPRAY | RESPIRATORY_TRACT | Status: DC | PRN

## 2014-08-08 MED ORDER — IPRATROPIUM-ALBUTEROL 0.5-2.5 (3) MG/3ML IN SOLN
3.0000 mL | Freq: Once | RESPIRATORY_TRACT | Status: DC
  Filled 2014-08-08: qty 3

## 2014-08-08 MED ORDER — IPRATROPIUM-ALBUTEROL 0.5-2.5 (3) MG/3ML IN SOLN
3.0000 mL | Freq: Once | RESPIRATORY_TRACT | Status: AC
  Administered 2014-08-08: 3 mL via RESPIRATORY_TRACT
  Filled 2014-08-08: qty 3

## 2014-08-08 MED ORDER — PREDNISONE 20 MG PO TABS: 40.0000 mg | ORAL_TABLET | Freq: Every day | ORAL | Status: DC

## 2014-08-08 NOTE — ED Notes (Signed)
Pt has a hx of asthma. Inhaler is empty and pt. Took last of prednisone yesterday.

## 2014-08-08 NOTE — ED Notes (Signed)
   Pt pulse ox 97%-98% while ambulating

## 2014-08-08 NOTE — Discharge Instructions (Signed)

## 2014-08-08 NOTE — ED Provider Notes (Signed)
CSN: [id number]     Arrival date & time 08/08/14  [id number] History   First MD Initiated Contact with Patient 08/08/14 [phone]     Chief Complaint  Patient presents with  . Asthma     (Consider location/radiation/quality/duration/timing/severity/associated sxs/prior Treatment) HPI Comments: Patient presents emergency department with chief complaint of asthma exacerbation. States that he has been having frequent exacerbations lately. 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 He is working on getting his Medicaid card so that he can obtain primary care follow-up. He was seen about a month ago for the same, and given prednisone and an inhaler. Patient has been trying to use this sparingly. States that he took one prednisone yesterday, but has not taken any other prednisone recently. States that he has run out of his inhaler. Reports having wheezing that worsened this morning. Denies any cough, fever, or chills.  The history is provided by the patient. No language interpreter was used.    Past Medical History  Diagnosis Date  . Asthma    History reviewed. No pertinent past surgical history. History reviewed. No pertinent family history. History  Substance Use Topics  . Smoking status: Former Smoker    Quit date: 02/15/2013  . Smokeless tobacco: Never Used  . Alcohol Use: No    Review of Systems  Constitutional: Negative for fever and chills.  Respiratory: Positive for shortness of breath and wheezing.   Cardiovascular: Negative for chest pain.  Gastrointestinal: Negative for nausea, vomiting, diarrhea and constipation.  Genitourinary: Negative for dysuria.  All other systems reviewed and are negative.     Allergies  Shellfish allergy and Onion  Home Medications   Prior to Admission medications   Medication Sig Start Date End Date Taking? Authorizing Provider  albuterol (PROVENTIL HFA;VENTOLIN HFA) 108  (90 BASE) MCG/ACT inhaler Inhale 1-2 puffs into the lungs every 6 (six) hours as needed for wheezing. 05/16/14   Garlon HatchetLisa M Sanders, PA-C  predniSONE (DELTASONE) 20 MG tablet Take 60 mg daily for 2 days then 40 mg daily for 2 days then 20 mg daily for 2 days then stop 07/15/14   Loren Raceravid Yelverton, MD   BP 113/75 mmHg  Pulse 73  Temp(Src) 98 F (36.7 C) (Oral)  Resp 18  Ht 5\' 8"  (1.727 m)  Wt 190 lb (86.183 kg)  BMI 28.90 kg/m2  SpO2 93% Physical Exam  Constitutional: He is oriented to person, place, and time. He appears well-developed and well-nourished.  HENT:  Head: Normocephalic and atraumatic.  Eyes: Conjunctivae and EOM are normal. Pupils are equal, round, and reactive to light. Right eye exhibits no discharge. Left eye exhibits no discharge. No scleral icterus.  Neck: Normal range of motion. Neck supple. No JVD present.  Cardiovascular: Normal rate, regular rhythm and normal heart sounds.  Exam reveals no gallop and no friction rub.   No murmur heard. Pulmonary/Chest: Effort normal. No respiratory distress. He has wheezes. He has no rales. He exhibits no tenderness.  Mild bilateral end expiratory wheezes  Abdominal: Soft. He exhibits no distension and no mass. There is no tenderness. There is no rebound and no guarding.  Musculoskeletal: Normal range of motion. He exhibits no edema or tenderness.  Neurological: He is alert and oriented to person, place, and time.  Skin: Skin is warm and dry.  Psychiatric: He has a normal mood and affect. His behavior is normal. Judgment and thought content normal.  Nursing  note and vitals reviewed.   ED Course  Procedures (including critical care time) Labs Review Labs Reviewed - No data to display  Imaging Review No results found.   EKG Interpretation None      MDM   Final diagnoses:  Asthma exacerbation    Patient with wheezing and asthma exacerbation. Will treat with prednisone and breathing treatment. Will reassess.  7:29  AM Patient feels improved after breathing treatment. Clear to auscultation bilaterally. Still states that he feels full tight, will give another breathing treatment, and will reassess.  Patient notified nurse, who in turn notified me that he does not want a second breathing treatment, but that he feels well enough to go home. Will discharge to home with prednisone, and inhaler. Patient continues to work on getting his Medicaid card. He is stable and ready for discharge.  Patient ambulated in ED with O2 saturations maintained >90, no current signs of respiratory distress. Lung exam improved after nebulizer treatment. Prednisone given in the ED and pt will bd dc with 5 day burst. Pt states they are breathing at baseline. Pt has been instructed to continue using prescribed medications and to speak with PCP about today's exacerbation.     Roxy Horsemanobert Jaleena Viviani, PA-C 08/08/14 [id number]  Gilda Creasehristopher J Pollina, MD 08/08/14 [phone]

## 2014-09-10 ENCOUNTER — Encounter (HOSPITAL_COMMUNITY): Payer: Self-pay | Admitting: Emergency Medicine

## 2014-09-10 ENCOUNTER — Emergency Department (HOSPITAL_COMMUNITY)
Admission: EM | Admit: 2014-09-10 | Discharge: 2014-09-10 | Disposition: A | Discharge status: Home / Self Care | Payer: Self-pay | Attending: Emergency Medicine | Admitting: Emergency Medicine

## 2014-09-10 DIAGNOSIS — J4521 Mild intermittent asthma with (acute) exacerbation: Secondary | ICD-10-CM | POA: Insufficient documentation

## 2014-09-10 DIAGNOSIS — Z87891 Personal history of nicotine dependence: Secondary | ICD-10-CM | POA: Insufficient documentation

## 2014-09-10 DIAGNOSIS — J4520 Mild intermittent asthma, uncomplicated: Secondary | ICD-10-CM

## 2014-09-10 MED ORDER — PREDNISONE 20 MG PO TABS
60.0000 mg | ORAL_TABLET | Freq: Once | ORAL | Status: AC
  Administered 2014-09-10: 60 mg via ORAL
  Filled 2014-09-10: qty 3

## 2014-09-10 MED ORDER — ALBUTEROL SULFATE HFA 108 (90 BASE) MCG/ACT IN AERS: 1.0000 | INHALATION_SPRAY | Freq: Four times a day (QID) | RESPIRATORY_TRACT | Status: DC | PRN

## 2014-09-10 MED ORDER — PREDNISONE 20 MG PO TABS: 20.0000 mg | ORAL_TABLET | Freq: Two times a day (BID) | ORAL | Status: DC

## 2014-09-10 MED ORDER — ALBUTEROL SULFATE HFA 108 (90 BASE) MCG/ACT IN AERS
2.0000 | INHALATION_SPRAY | Freq: Four times a day (QID) | RESPIRATORY_TRACT | Status: DC | PRN
  Administered 2014-09-10: 2 via RESPIRATORY_TRACT
  Filled 2014-09-10: qty 6.7

## 2014-09-10 MED ORDER — ALBUTEROL (5 MG/ML) CONTINUOUS INHALATION SOLN
10.0000 mg/h | INHALATION_SOLUTION | Freq: Once | RESPIRATORY_TRACT | Status: AC
  Administered 2014-09-10: 10 mg/h via RESPIRATORY_TRACT
  Filled 2014-09-10: qty 20

## 2014-09-10 NOTE — ED Notes (Signed)
RT called made aware of Neb.treatment needed.

## 2014-09-10 NOTE — ED Notes (Signed)
To ED via private vehicle with c/o asthma attack-- started last night while at work -- using a different chemical than normal to clean with.

## 2014-09-10 NOTE — Progress Notes (Signed)
Buddy Duty [facility] for [facility] [phone]  Spoke to patient regarding primary care resources and the [facility] orange card. Patient has been provided with information in prior visits, but has not followed up. Orange card application and instructions provided. Resource guide and my contact information also provided for any future questions or concerns. No other [facility]ist needs identified at this time.

## 2014-09-10 NOTE — ED Provider Notes (Signed)
CSN: [id number]     Arrival date & time 09/10/14  0946 History   First MD Initiated Contact with Patient 09/10/14 [phone]     Chief Complaint  Patient presents with  . Asthma      HPI  Patient presents for evaluation of asthma exacerbation. More short of breath with cough this morning on his way to work. Ran out of his albuterol inhaler 2 days ago. States seasonal allergies tender making flareup. No chest pain. No fevers or chills. No lower extremity swelling. Nonsmoker.  Seen to the ER for his asthma in the past. Has never been admitted to hospital.  Past Medical History  Diagnosis Date  . Asthma    History reviewed. No pertinent past surgical history. History reviewed. No pertinent family history. History  Substance Use Topics  . Smoking status: Former Smoker    Quit date: 02/15/2013  . Smokeless tobacco: Never Used  . Alcohol Use: No    Review of Systems  Constitutional: Negative for fever, chills, diaphoresis, appetite change and fatigue.  HENT: Negative for mouth sores, sore throat and trouble swallowing.   Eyes: Negative for visual disturbance.  Respiratory: Positive for cough and wheezing. Negative for chest tightness and shortness of breath.   Cardiovascular: Negative for chest pain.  Gastrointestinal: Negative for nausea, vomiting, abdominal pain, diarrhea and abdominal distention.  Endocrine: Negative for polydipsia, polyphagia and polyuria.  Genitourinary: Negative for dysuria, frequency and hematuria.  Musculoskeletal: Negative for gait problem.  Skin: Negative for color change, pallor and rash.  Neurological: Negative for dizziness, syncope, light-headedness and headaches.  Hematological: Does not bruise/bleed easily.  Psychiatric/Behavioral: Negative for behavioral problems and confusion.      Allergies  Shellfish allergy and Onion  Home Medications   Prior to Admission medications   Medication Sig Start Date End Date Taking? Authorizing Provider   albuterol (PROVENTIL HFA;VENTOLIN HFA) 108 (90 BASE) MCG/ACT inhaler Inhale 1-2 puffs into the lungs every 6 (six) hours as needed for wheezing. 09/10/14   Rolland Porter, MD  predniSONE (DELTASONE) 20 MG tablet Take 1 tablet (20 mg total) by mouth 2 (two) times daily with a meal. 09/10/14   Rolland Porter, MD   BP 109/58 mmHg  Pulse 60  Temp(Src) 98.3 F (36.8 C) (Oral)  Resp 20  SpO2 100% Physical Exam  Constitutional: He is oriented to person, place, and time. He appears well-developed and well-nourished. No distress.  HENT:  Head: Normocephalic.  Eyes: Conjunctivae are normal. Pupils are equal, round, and reactive to light. No scleral icterus.  Neck: Normal range of motion. Neck supple. No thyromegaly present.  Cardiovascular: Normal rate and regular rhythm.  Exam reveals no gallop and no friction rub.   No murmur heard. Pulmonary/Chest: Effort normal and breath sounds normal. No respiratory distress. He has no wheezes. He has no rales.  Global diminished breath sounds. Wheezing and prolongation in all fields.  Abdominal: Soft. Bowel sounds are normal. He exhibits no distension. There is no tenderness. There is no rebound.  Musculoskeletal: Normal range of motion.  Neurological: He is alert and oriented to person, place, and time.  Skin: Skin is warm and dry. No rash noted.  Psychiatric: He has a normal mood and affect. His behavior is normal.    ED Course  Procedures (including critical care time) Labs Review Labs Reviewed - No data to display  Imaging Review No results found.   EKG Interpretation None      MDM   Final diagnoses:  Asthma, mild  intermittent, uncomplicated    Patient one hour Step-year-old treatment. On reexam his lungs are clear. He states he feels "much better". Well oxygenated. Discharge home. 5 day course prednisone. Albuterol MDI. Primary care follow-up.    Rolland Porter, MD 09/10/14 [phone]

## 2014-09-10 NOTE — Discharge Instructions (Signed)

## 2014-09-10 NOTE — ED Notes (Signed)
MD at bedside. Dr. James. 

## 2014-10-19 ENCOUNTER — Other Ambulatory Visit: Payer: Self-pay

## 2014-10-19 ENCOUNTER — Encounter (HOSPITAL_COMMUNITY): Payer: Self-pay | Admitting: Emergency Medicine

## 2014-10-19 ENCOUNTER — Emergency Department (HOSPITAL_COMMUNITY)
Admission: EM | Admit: 2014-10-19 | Discharge: 2014-10-20 | Disposition: A | Discharge status: Home / Self Care | Payer: Self-pay | Attending: Emergency Medicine | Admitting: Emergency Medicine

## 2014-10-19 DIAGNOSIS — Z7952 Long term (current) use of systemic steroids: Secondary | ICD-10-CM | POA: Insufficient documentation

## 2014-10-19 DIAGNOSIS — Z87891 Personal history of nicotine dependence: Secondary | ICD-10-CM | POA: Insufficient documentation

## 2014-10-19 DIAGNOSIS — J4520 Mild intermittent asthma, uncomplicated: Secondary | ICD-10-CM | POA: Insufficient documentation

## 2014-10-19 MED ORDER — IPRATROPIUM-ALBUTEROL 0.5-2.5 (3) MG/3ML IN SOLN
3.0000 mL | Freq: Once | RESPIRATORY_TRACT | Status: AC
  Administered 2014-10-19: 3 mL via RESPIRATORY_TRACT

## 2014-10-19 MED ORDER — IPRATROPIUM-ALBUTEROL 0.5-2.5 (3) MG/3ML IN SOLN
RESPIRATORY_TRACT | Status: AC
  Filled 2014-10-19: qty 3

## 2014-10-19 NOTE — ED Provider Notes (Signed)
CSN: [id number]     Arrival date & time 10/19/14  2340 History   First MD Initiated Contact with Patient 10/19/14 2356     Chief Complaint  Patient presents with  . Shortness of Breath     (Consider location/radiation/quality/duration/timing/severity/associated sxs/prior Treatment) HPI   29 year old male with history of asthma who presents for evaluation of shortness of breath. Patient developed increased short of breath, wheezing this afternoon after being outside for prolonged period of time. He did take leftover prednisone which provide some relief but still endorsed shortness of breath. Besides come to ER for evaluation. He does not have a rescue inhaler available. He denies any fever, chills, productive cough, hemoptysis, chest pain, abdominal pain. No prior history of PE or DVT, no recent surgery, prolonged bed rest, unilateral leg swelling or calf pain. No prior history of ICU stay or intubation.   Past Medical History  Diagnosis Date  . Asthma    History reviewed. No pertinent past surgical history. No family history on file. History  Substance Use Topics  . Smoking status: Former Smoker    Quit date: 02/15/2013  . Smokeless tobacco: Never Used  . Alcohol Use: No    Review of Systems  All other systems reviewed and are negative.     Allergies  Shellfish allergy and Onion  Home Medications   Prior to Admission medications   Medication Sig Start Date End Date Taking? Authorizing Provider  albuterol (PROVENTIL HFA;VENTOLIN HFA) 108 (90 BASE) MCG/ACT inhaler Inhale 1-2 puffs into the lungs every 6 (six) hours as needed for wheezing. 09/10/14   Rolland Porter, MD  predniSONE (DELTASONE) 20 MG tablet Take 1 tablet (20 mg total) by mouth 2 (two) times daily with a meal. 09/10/14   Rolland Porter, MD   BP 133/83 mmHg  Pulse 78  Temp(Src) 98 F (36.7 C) (Oral)  Resp 17  SpO2 95% Physical Exam  Constitutional: He is oriented to person, place, and time. He appears well-developed  and well-nourished. No distress.  African-American male, nontoxic in appearance  HENT:  Head: Atraumatic.  Eyes: Conjunctivae are normal.  Neck: Neck supple. No JVD present.  Cardiovascular: Normal rate, regular rhythm and intact distal pulses.  Exam reveals no gallop and no friction rub.   Pulmonary/Chest: Effort normal and breath sounds normal. No respiratory distress. He has no wheezes. He has no rales. He exhibits no tenderness.  Abdominal: Soft. There is no tenderness.  Musculoskeletal: He exhibits no edema.  Neurological: He is alert and oriented to person, place, and time.  Skin: No rash noted.  Psychiatric: He has a normal mood and affect.  Nursing note and vitals reviewed.   ED Course  Procedures (including critical care time)  Patient here with increased shortness of breath, history of asthma. His symptoms improved after receiving albuterol and Atrovent. Symptoms consistence with mild intermittent asthma. No fever, productive cough, hypoxia to suggest pneumonia.  12:26 AM Patient ambulate without any difficulty, maintaining 95 % oxygen on room air. He is stable for discharge.  Labs Review Labs Reviewed  CBC  COMPREHENSIVE METABOLIC PANEL  I-STAT TROPOININ, ED    Imaging Review No results found.   EKG Interpretation None      MDM   Final diagnoses:  Asthma, mild intermittent, uncomplicated    BP 133/83 mmHg  Pulse 78  Temp(Src) 98 F (36.7 C) (Oral)  Resp 17  SpO2 95%     Fayrene Helper, PA-C 10/20/14 1324  Marisa Severin, MD 10/20/14 [phone]

## 2014-10-19 NOTE — ED Notes (Signed)
Patient is an asthmatic, having some shortness of breath.  Patient states that he took one of his prednisones and it helped.  Patient continuing with some shortness of breath.

## 2014-10-20 MED ORDER — ALBUTEROL SULFATE HFA 108 (90 BASE) MCG/ACT IN AERS: 1.0000 | INHALATION_SPRAY | Freq: Four times a day (QID) | RESPIRATORY_TRACT | Status: DC | PRN

## 2014-10-20 MED ORDER — ALBUTEROL SULFATE HFA 108 (90 BASE) MCG/ACT IN AERS
2.0000 | INHALATION_SPRAY | RESPIRATORY_TRACT | Status: DC | PRN
  Administered 2014-10-20: 2 via RESPIRATORY_TRACT
  Filled 2014-10-20: qty 6.7

## 2014-10-20 MED ORDER — ALBUTEROL SULFATE HFA 108 (90 BASE) MCG/ACT IN AERS: 2.0000 | INHALATION_SPRAY | RESPIRATORY_TRACT | Status: DC | PRN

## 2014-10-20 NOTE — Discharge Instructions (Signed)

## 2015-01-16 ENCOUNTER — Emergency Department (HOSPITAL_COMMUNITY)
Admission: EM | Admit: 2015-01-16 | Discharge: 2015-01-16 | Disposition: A | Discharge status: Home / Self Care | Payer: Self-pay | Attending: Emergency Medicine | Admitting: Emergency Medicine

## 2015-01-16 ENCOUNTER — Encounter (HOSPITAL_COMMUNITY): Payer: Self-pay | Admitting: Physical Medicine and Rehabilitation

## 2015-01-16 DIAGNOSIS — Z720 Tobacco use: Secondary | ICD-10-CM | POA: Insufficient documentation

## 2015-01-16 DIAGNOSIS — J45901 Unspecified asthma with (acute) exacerbation: Secondary | ICD-10-CM | POA: Insufficient documentation

## 2015-01-16 DIAGNOSIS — Z79899 Other long term (current) drug therapy: Secondary | ICD-10-CM | POA: Insufficient documentation

## 2015-01-16 DIAGNOSIS — Z7952 Long term (current) use of systemic steroids: Secondary | ICD-10-CM | POA: Insufficient documentation

## 2015-01-16 MED ORDER — ALBUTEROL SULFATE HFA 108 (90 BASE) MCG/ACT IN AERS
2.0000 | INHALATION_SPRAY | Freq: Once | RESPIRATORY_TRACT | Status: AC
  Administered 2015-01-16: 2 via RESPIRATORY_TRACT
  Filled 2015-01-16: qty 6.7

## 2015-01-16 MED ORDER — ALBUTEROL SULFATE HFA 108 (90 BASE) MCG/ACT IN AERS: 1.0000 | INHALATION_SPRAY | Freq: Four times a day (QID) | RESPIRATORY_TRACT | Status: DC | PRN

## 2015-01-16 NOTE — ED Provider Notes (Signed)
CSN: [id number]     Arrival date & time 01/16/15  [id number] History   First MD Initiated Contact with Patient 01/16/15 0848     Chief Complaint  Patient presents with  . Asthma     (Consider location/radiation/quality/duration/timing/severity/associated sxs/prior Treatment) HPI Comments: Patient with history of asthma presents with worsening wheezing, SOB, occasional non-productive cough starting last night. Patient took inhaler 1 this morning and is now out. Also took an albuterol nebulizer treatment from his stepsons nebulizer machine. Both of these medications helped a lot. Patient is here because he does not currently have an inhaler. He does have albuterol solution at home. No fevers, vomiting. No associated URI sx. No lightheadedness or syncope. No chest pain. The onset of this condition was acute. The course is improving. Aggravating factors: none. Alleviating factors: none.    The history is provided by the patient.    Past Medical History  Diagnosis Date  . Asthma    History reviewed. No pertinent past surgical history. History reviewed. No pertinent family history. Social History  Substance Use Topics  . Smoking status: Current Every Day Smoker    Types: Cigarettes    Last Attempt to Quit: 02/15/2013  . Smokeless tobacco: Never Used  . Alcohol Use: No    Review of Systems  Constitutional: Negative for fever.  HENT: Negative for rhinorrhea and sore throat.   Eyes: Negative for redness.  Respiratory: Positive for cough, shortness of breath and wheezing.   Cardiovascular: Negative for chest pain.  Gastrointestinal: Negative for nausea, vomiting, abdominal pain and diarrhea.  Genitourinary: Negative for dysuria.  Musculoskeletal: Negative for myalgias.  Skin: Negative for rash.  Neurological: Negative for headaches.      Allergies  Shellfish allergy and Onion  Home Medications   Prior to Admission medications   Medication Sig Start Date End Date Taking?  Authorizing Provider  albuterol (PROVENTIL HFA;VENTOLIN HFA) 108 (90 BASE) MCG/ACT inhaler Inhale 1-2 puffs into the lungs every 6 (six) hours as needed for wheezing. 10/20/14   Fayrene HelperBowie Tran, PA-C  predniSONE (DELTASONE) 20 MG tablet Take 1 tablet (20 mg total) by mouth 2 (two) times daily with a meal. 09/10/14   Rolland PorterMark James, MD   BP 109/84 mmHg  Pulse 75  Temp(Src) 98.7 F (37.1 C) (Oral)  Resp 18  Ht 5\' 8"  (1.727 m)  Wt 192 lb (87.091 kg)  BMI 29.20 kg/m2  SpO2 99% Physical Exam  Constitutional: He appears well-developed and well-nourished.  HENT:  Head: Normocephalic and atraumatic.  Eyes: Conjunctivae are normal. Right eye exhibits no discharge. Left eye exhibits no discharge.  Neck: Normal range of motion. Neck supple.  Cardiovascular: Normal rate, regular rhythm and normal heart sounds.   No murmur heard. Pulmonary/Chest: Effort normal. He has wheezes (minimal, scattered, expiratory).  Abdominal: Soft. There is no tenderness.  Neurological: He is alert.  Skin: Skin is warm and dry.  Psychiatric: He has a normal mood and affect.  Nursing note and vitals reviewed.   ED Course  Procedures (including critical care time) Labs Review Labs Reviewed - No data to display  Imaging Review No results found. I have personally reviewed and evaluated these images and lab results as part of my medical decision-making.   EKG Interpretation None       9:00 AM Patient seen and examined. Medications ordered.   Vital signs reviewed and are as follows: BP 109/84 mmHg  Pulse 75  Temp(Src) 98.7 F (37.1 C) (Oral)  Resp 18  Ht 5'  8" (1.727 m)  Wt 192 lb (87.091 kg)  BMI 29.20 kg/m2  SpO2 99%  D/c to home with inhaler. Patient does not feel as if he needs steroids, and given marked reported improvement with previous albuterol and not requiring albuterol here, that this is appropriate.  Patient urged to return with worsening symptoms or other concerns. Patient verbalized understanding  and agrees with plan.    MDM   Final diagnoses:  Asthma, unspecified asthma severity, with acute exacerbation   Patients with uncomplicated asthma exacerbation. No infection suspected. Do not suspect pneumonia. Patient appears well and vital signs are within normal limits.   Renne Crigler, PA-C 01/16/15 4098  Gilda Crease, MD 01/16/15 [phone]

## 2015-01-16 NOTE — ED Notes (Signed)
Pt presents to department for evaluation of asthma. Reports shortness of breath x1 day. Ran out of inhaler at home. Also reports dry cough. Pt is alert and oriented x4. Respirations unlabored.

## 2015-01-16 NOTE — Discharge Instructions (Signed)
Please read and follow all provided instructions.  Your diagnoses today include:  1. Asthma, unspecified asthma severity, with acute exacerbation    Tests performed today include:  Vital signs. See below for your results today.   Medications prescribed:   Albuterol inhaler - medication that opens up your airway  Use inhaler as follows: 1-2 puffs with spacer every 4 hours as needed for wheezing, cough, or shortness of breath.   Take any prescribed medications only as directed.  Home care instructions:  Follow any educational materials contained in this packet.  Follow-up instructions: Please follow-up with your primary care provider in the next 3 days for further evaluation of your symptoms and management of your asthma.  Return instructions:   Please return to the Emergency Department if you experience worsening symptoms.  Please return with worsening wheezing, shortness of breath, or difficulty breathing.  Return with persistent fever above 101F.   Please return if you have any other emergent concerns.  Additional Information:  Your vital signs today were: BP 109/84 mmHg   Pulse 75   Temp(Src) 98.7 F (37.1 C) (Oral)   Resp 18   Ht 5\' 8"  (1.727 m)   Wt 192 lb (87.091 kg)   BMI 29.20 kg/m2   SpO2 99% If your blood pressure (BP) was elevated above 135/85 this visit, please have this repeated by your doctor within one month. --------------

## 2015-10-05 ENCOUNTER — Encounter (HOSPITAL_COMMUNITY): Payer: Self-pay

## 2015-10-05 ENCOUNTER — Emergency Department (HOSPITAL_COMMUNITY)
Admission: EM | Admit: 2015-10-05 | Discharge: 2015-10-05 | Disposition: A | Discharge status: Home / Self Care | Payer: Self-pay | Attending: Emergency Medicine | Admitting: Emergency Medicine

## 2015-10-05 DIAGNOSIS — F17210 Nicotine dependence, cigarettes, uncomplicated: Secondary | ICD-10-CM | POA: Insufficient documentation

## 2015-10-05 DIAGNOSIS — J45909 Unspecified asthma, uncomplicated: Secondary | ICD-10-CM | POA: Insufficient documentation

## 2015-10-05 MED ORDER — ALBUTEROL SULFATE (2.5 MG/3ML) 0.083% IN NEBU
INHALATION_SOLUTION | RESPIRATORY_TRACT | Status: AC
  Administered 2015-10-05: 5 mg via RESPIRATORY_TRACT
  Filled 2015-10-05: qty 6

## 2015-10-05 MED ORDER — ALBUTEROL SULFATE HFA 108 (90 BASE) MCG/ACT IN AERS
1.0000 | INHALATION_SPRAY | RESPIRATORY_TRACT | Status: DC | PRN
  Administered 2015-10-05: 2 via RESPIRATORY_TRACT
  Filled 2015-10-05: qty 6.7

## 2015-10-05 MED ORDER — ALBUTEROL SULFATE HFA 108 (90 BASE) MCG/ACT IN AERS: 2.0000 | INHALATION_SPRAY | RESPIRATORY_TRACT | Status: DC | PRN

## 2015-10-05 MED ORDER — ALBUTEROL SULFATE (2.5 MG/3ML) 0.083% IN NEBU
5.0000 mg | INHALATION_SOLUTION | Freq: Once | RESPIRATORY_TRACT | Status: AC
  Administered 2015-10-05: 5 mg via RESPIRATORY_TRACT

## 2015-10-05 NOTE — Discharge Instructions (Signed)
Asthma, Adult Asthma is a recurring condition in which the airways tighten and narrow. Asthma can make it difficult to breathe. It can cause coughing, wheezing, and shortness of breath. Asthma episodes, also called asthma attacks, range from minor to life-threatening. Asthma cannot be cured, but medicines and lifestyle changes can help control it. CAUSES Asthma is believed to be caused by inherited (genetic) and environmental factors, but its exact cause is unknown. Asthma may be triggered by allergens, lung infections, or irritants in the air. Asthma triggers are different for each person. Common triggers include:   Animal dander.  Dust mites.  Cockroaches.  Pollen from trees or grass.  Mold.  Smoke.  Air pollutants such as dust, household cleaners, hair sprays, aerosol sprays, paint fumes, strong chemicals, or strong odors.  Cold air, weather changes, and winds (which increase molds and pollens in the air).  Strong emotional expressions such as crying or laughing hard.  Stress.  Certain medicines (such as aspirin) or types of drugs (such as beta-blockers).  Sulfites in foods and drinks. Foods and drinks that may contain sulfites include dried fruit, potato chips, and sparkling grape juice.  Infections or inflammatory conditions such as the flu, a cold, or an inflammation of the nasal membranes (rhinitis).  Gastroesophageal reflux disease (GERD).  Exercise or strenuous activity. SYMPTOMS Symptoms may occur immediately after asthma is triggered or many hours later. Symptoms include:  Wheezing.  Excessive nighttime or early morning coughing.  Frequent or severe coughing with a common cold.  Chest tightness.  Shortness of breath. DIAGNOSIS  The diagnosis of asthma is made by a review of your medical history and a physical exam. Tests may also be performed. These may include:  Lung function studies. These tests show how much air you breathe in and out.  Allergy  tests.  Imaging tests such as X-rays. TREATMENT  Asthma cannot be cured, but it can usually be controlled. Treatment involves identifying and avoiding your asthma triggers. It also involves medicines. There are 2 classes of medicine used for asthma treatment:   Controller medicines. These prevent asthma symptoms from occurring. They are usually taken every day.  Reliever or rescue medicines. These quickly relieve asthma symptoms. They are used as needed and provide short-term relief. Your health care provider will help you create an asthma action plan. An asthma action plan is a written plan for managing and treating your asthma attacks. It includes a list of your asthma triggers and how they may be avoided. It also includes information on when medicines should be taken and when their dosage should be changed. An action plan may also involve the use of a device called a peak flow meter. A peak flow meter measures how well the lungs are working. It helps you monitor your condition. HOME CARE INSTRUCTIONS   Take medicines only as directed by your health care provider. Speak with your health care provider if you have questions about how or when to take the medicines.  Use a peak flow meter as directed by your health care provider. Record and keep track of readings.  Understand and use the action plan to help minimize or stop an asthma attack without needing to seek medical care.  Control your home environment in the following ways to help prevent asthma attacks:  Do not smoke. Avoid being exposed to secondhand smoke.  Change your heating and air conditioning filter regularly.  Limit your use of fireplaces and wood stoves.  Get rid of pests (such as roaches   and mice) and their droppings.  Throw away plants if you see mold on them.  Clean your floors and dust regularly. Use unscented cleaning products.  Try to have someone else vacuum for you regularly. Stay out of rooms while they are  being vacuumed and for a short while afterward. If you vacuum, use a dust mask from a hardware store, a double-layered or microfilter vacuum cleaner bag, or a vacuum cleaner with a HEPA filter.  Replace carpet with wood, tile, or vinyl flooring. Carpet can trap dander and dust.  Use allergy-proof pillows, mattress covers, and box spring covers.  Wash bed sheets and blankets every week in hot water and dry them in a dryer.  Use blankets that are made of polyester or cotton.  Clean bathrooms and kitchens with bleach. If possible, have someone repaint the walls in these rooms with mold-resistant paint. Keep out of the rooms that are being cleaned and painted.  Wash hands frequently. SEEK MEDICAL CARE IF:   You have wheezing, shortness of breath, or a cough even if taking medicine to prevent attacks.  The colored mucus you cough up (sputum) is thicker than usual.  Your sputum changes from clear or white to yellow, green, gray, or bloody.  You have any problems that may be related to the medicines you are taking (such as a rash, itching, swelling, or trouble breathing).  You are using a reliever medicine more than 2-3 times per week.  Your peak flow is still at 50-79% of your personal best after following your action plan for 1 hour.  You have a fever. SEEK IMMEDIATE MEDICAL CARE IF:   You seem to be getting worse and are unresponsive to treatment during an asthma attack.  You are short of breath even at rest.  You get short of breath when doing very little physical activity.  You have difficulty eating, drinking, or talking due to asthma symptoms.  You develop chest pain.  You develop a fast heartbeat.  You have a bluish color to your lips or fingernails.  You are light-headed, dizzy, or faint.  Your peak flow is less than 50% of your personal best.   This information is not intended to replace advice given to you by your health care provider. Make sure you discuss any  questions you have with your health care provider.   Document Released: 03/14/2005 Document Revised: 12/03/2014 Document Reviewed: 10/11/2012 Elsevier Interactive Patient Education 2016 [employer]  

## 2015-10-05 NOTE — ED Provider Notes (Signed)
CSN: [id number]     Arrival date & time 10/05/15  0945 History  By signing my name below, I, Essence Howell, attest that this documentation has been prepared under the direction and in the presence of Cheri Fowler, PA-C Electronically Signed: Charline Bills, ED Scribe 10/05/2015 at 10:57 AM.   Chief Complaint  Patient presents with  . asthma-needs inhaler    The history is provided by the patient. No language interpreter was used.   HPI Comments: Henry Schroeder is a 30 y.o. male, with a h/o asthma, who presents to the Emergency Department for a refill of an albuterol inhaler which he ran out of yesterday. Pt reports chest tightness that has resolved after a nebulizer treatment in the waiting room. He does not have a home nebulizer. He denies fever, dry cough, chest pain, SOB, leg swelling and any other symptoms.   Past Medical History  Diagnosis Date  . Asthma    History reviewed. No pertinent past surgical history. No family history on file. Social History  Substance Use Topics  . Smoking status: Current Every Day Smoker    Types: Cigarettes    Last Attempt to Quit: 02/15/2013  . Smokeless tobacco: Never Used  . Alcohol Use: No    Review of Systems  Constitutional: Negative for fever.  Respiratory: Positive for chest tightness (resolved). Negative for cough and shortness of breath.   Cardiovascular: Negative for chest pain and leg swelling.   Allergies  Shellfish allergy and Onion  Home Medications   Prior to Admission medications   Medication Sig Start Date End Date Taking? Authorizing Provider  albuterol (PROVENTIL HFA;VENTOLIN HFA) 108 (90 BASE) MCG/ACT inhaler Inhale 1-2 puffs into the lungs every 6 (six) hours as needed for wheezing. 01/16/15   Renne Crigler, PA-C  predniSONE (DELTASONE) 20 MG tablet Take 1 tablet (20 mg total) by mouth 2 (two) times daily with a meal. 09/10/14   Rolland Porter, MD   BP 109/77 mmHg  Pulse 74  Temp(Src) 98.4 F (36.9 C) (Oral)  Resp 16  SpO2  97% Physical Exam  Constitutional: He is oriented to person, place, and time. He appears well-developed and well-nourished.  Non-toxic appearance. He does not have a sickly appearance. He does not appear ill.  HENT:  Head: Normocephalic and atraumatic.  Right Ear: External ear normal.  Left Ear: External ear normal.  Mouth/Throat: Oropharynx is clear and moist.  Eyes: Conjunctivae are normal. No scleral icterus.  Neck: Normal range of motion. Neck supple. No tracheal deviation present.  Cardiovascular: Normal rate and regular rhythm.   No unilateral lower extremity edema.   Pulmonary/Chest: Effort normal and breath sounds normal. No accessory muscle usage or stridor. No respiratory distress. He has no wheezes. He has no rhonchi. He has no rales.  Abdominal: Soft. Bowel sounds are normal. He exhibits no distension. There is no tenderness.  Musculoskeletal: Normal range of motion.  Lymphadenopathy:    He has no cervical adenopathy.  Neurological: He is alert and oriented to person, place, and time.  Speech clear without dysarthria.  Skin: Skin is warm and dry.  Psychiatric: He has a normal mood and affect. His behavior is normal.   ED Course  Procedures (including critical care time) DIAGNOSTIC STUDIES: Oxygen Saturation is 97% on RA, normal by my interpretation.    COORDINATION OF CARE: 10:38 AM-Discussed treatment plan which includes albuterol inhaler with pt at bedside and pt agreed to plan.   MDM   Final diagnoses:  Asthma, unspecified asthma  severity, uncomplicated   Patient with history of asthma presenting for inhaler refill. No systemic symptoms. Presentation not concerning for PE. Inhaler refilled. [facility]. Discussed return precautions. Patient agrees and acknowledges the above plan for discharge.  I personally performed the services described in this documentation, which was scribed in my presence. The recorded information  has been reviewed and is accurate.    Cheri FowlerKayla Paelyn Smick, PA-C 10/05/15 1121  Tilden FossaElizabeth Rees, MD 10/06/15 [phone]

## 2015-10-05 NOTE — ED Notes (Signed)
Patient requested ned treatment while waiting at triage, administered as requested, patient in no distress

## 2015-10-05 NOTE — ED Notes (Signed)
Patient here requesting prescription for inhaler, out of as of yesterday and feels tight, no distress

## 2015-10-05 NOTE — ED Notes (Signed)
Declined W/C at D/C and was escorted to lobby by RN. 

## 2016-11-16 ENCOUNTER — Encounter (HOSPITAL_COMMUNITY): Payer: Self-pay

## 2016-11-16 ENCOUNTER — Emergency Department (HOSPITAL_COMMUNITY)
Admission: EM | Admit: 2016-11-16 | Discharge: 2016-11-16 | Disposition: A | Discharge status: Home / Self Care | Payer: Self-pay | Attending: Emergency Medicine | Admitting: Emergency Medicine

## 2016-11-16 DIAGNOSIS — F17210 Nicotine dependence, cigarettes, uncomplicated: Secondary | ICD-10-CM | POA: Insufficient documentation

## 2016-11-16 DIAGNOSIS — J4521 Mild intermittent asthma with (acute) exacerbation: Secondary | ICD-10-CM | POA: Insufficient documentation

## 2016-11-16 MED ORDER — ALBUTEROL SULFATE (2.5 MG/3ML) 0.083% IN NEBU
5.0000 mg | INHALATION_SOLUTION | Freq: Once | RESPIRATORY_TRACT | Status: AC
  Administered 2016-11-16: 5 mg via RESPIRATORY_TRACT

## 2016-11-16 MED ORDER — ALBUTEROL SULFATE (2.5 MG/3ML) 0.083% IN NEBU
INHALATION_SOLUTION | RESPIRATORY_TRACT | Status: AC
  Filled 2016-11-16: qty 3

## 2016-11-16 MED ORDER — ALBUTEROL SULFATE HFA 108 (90 BASE) MCG/ACT IN AERS
2.0000 | INHALATION_SPRAY | RESPIRATORY_TRACT | Status: DC | PRN
  Administered 2016-11-16: 2 via RESPIRATORY_TRACT
  Filled 2016-11-16: qty 6.7

## 2016-11-16 NOTE — ED Provider Notes (Signed)
[facility]-EMERGENCY DEPT Provider Note   CSN: [id number] Arrival date & time: 11/16/16  2228     History   Chief Complaint Chief Complaint  Patient presents with  . Asthma    HPI Henry Schroeder is a 31 y.o. male.  This a 31 year old male with a history of asthma who ran out of his inhaler.  3 days ago.  Tonight he started having some wheezing and difficulty breathing. He received an albuterol treatment at triage by the time of my examination was symptom-free.      Past Medical History:  Diagnosis Date  . Asthma     There are no active problems to display for this patient.   History reviewed. No pertinent surgical history.     Home Medications    Prior to Admission medications   Medication Sig Start Date End Date Taking? Authorizing Provider  albuterol (PROVENTIL HFA;VENTOLIN HFA) 108 (90 Base) MCG/ACT inhaler Inhale 2 puffs into the lungs every 4 (four) hours as needed for wheezing or shortness of breath. 10/05/15   Cheri Fowler, PA-C  predniSONE (DELTASONE) 20 MG tablet Take 1 tablet (20 mg total) by mouth 2 (two) times daily with a meal. 09/10/14   Rolland Porter, MD    Family History No family history on file.  Social History Social History  Substance Use Topics  . Smoking status: Current Every Day Smoker    Types: Cigarettes    Last attempt to quit: 02/15/2013  . Smokeless tobacco: Never Used  . Alcohol use No     Allergies   Shellfish allergy and Onion   Review of Systems Review of Systems  Constitutional: Negative for fever.  HENT: Negative for congestion.   Respiratory: Positive for wheezing.   Cardiovascular: Negative for chest pain.  All other systems reviewed and are negative.    Physical Exam Updated Vital Signs BP 112/76 (BP Location: Right Arm)   Pulse 76   Temp 98.4 F (36.9 C) (Oral)   Resp 18   Wt 87.1 kg (192 lb)   SpO2 96%   BMI 29.19 kg/m   Physical Exam  Constitutional: He appears well-developed and well-nourished. No  distress.  HENT:  Head: Normocephalic.  Eyes: Pupils are equal, round, and reactive to light.  Neck: Normal range of motion.  Cardiovascular: Normal rate.   Pulmonary/Chest: Effort normal and breath sounds normal. No respiratory distress. He has no wheezes.  Examined after patient received an albuterol treatment at triage  Musculoskeletal: Normal range of motion.  Neurological: He is alert.  Skin: Skin is warm and dry.  Psychiatric: He has a normal mood and affect.  Nursing note and vitals reviewed.    ED Treatments / Results  Labs (all labs ordered are listed, but only abnormal results are displayed) Labs Reviewed - No data to display  EKG  EKG Interpretation None       Radiology No results found.  Procedures Procedures (including critical care time)  Medications Ordered in ED Medications  albuterol (PROVENTIL) (2.5 MG/3ML) 0.083% nebulizer solution 5 mg (5 mg Nebulization Given 11/16/16 2248)     Initial Impression / Assessment and Plan / ED Course  I have reviewed the triage vital signs and the nursing notes.  Pertinent labs & imaging results that were available during my care of the patient were reviewed by me and considered in my medical decision making (see chart for details).      atient was encouraged to establish care with a medical provider.  He was  provided with an inhaler.  Final Clinical Impressions(s) / ED Diagnoses   Final diagnoses:  Mild intermittent asthma with exacerbation    New Prescriptions Discharge Medication List as of 11/16/2016 11:24 PM       Earley Favor, NP 11/20/16 2123    Gilda Crease, MD 11/20/16 2314

## 2016-11-16 NOTE — Discharge Instructions (Signed)
It is important that you establish care with a medical provider for regular care

## 2016-11-16 NOTE — ED Triage Notes (Signed)
Pt reports he has been having asthma flare up x 2 days with wheezing. Pt reports needing inhaler and breathing tx.

## 2016-12-19 ENCOUNTER — Encounter (HOSPITAL_COMMUNITY): Payer: Self-pay | Admitting: Emergency Medicine

## 2016-12-19 ENCOUNTER — Emergency Department (HOSPITAL_COMMUNITY): Payer: Self-pay

## 2016-12-19 ENCOUNTER — Emergency Department (HOSPITAL_COMMUNITY)
Admission: EM | Admit: 2016-12-19 | Discharge: 2016-12-19 | Disposition: A | Discharge status: Home / Self Care | Payer: Self-pay | Attending: Emergency Medicine | Admitting: Emergency Medicine

## 2016-12-19 DIAGNOSIS — Z79899 Other long term (current) drug therapy: Secondary | ICD-10-CM | POA: Insufficient documentation

## 2016-12-19 DIAGNOSIS — J4531 Mild persistent asthma with (acute) exacerbation: Secondary | ICD-10-CM | POA: Insufficient documentation

## 2016-12-19 DIAGNOSIS — R0602 Shortness of breath: Secondary | ICD-10-CM

## 2016-12-19 DIAGNOSIS — J40 Bronchitis, not specified as acute or chronic: Secondary | ICD-10-CM | POA: Insufficient documentation

## 2016-12-19 DIAGNOSIS — F17210 Nicotine dependence, cigarettes, uncomplicated: Secondary | ICD-10-CM | POA: Insufficient documentation

## 2016-12-19 MED ORDER — ALBUTEROL SULFATE (2.5 MG/3ML) 0.083% IN NEBU
INHALATION_SOLUTION | RESPIRATORY_TRACT | Status: AC
  Filled 2016-12-19: qty 6

## 2016-12-19 MED ORDER — ALBUTEROL SULFATE (2.5 MG/3ML) 0.083% IN NEBU
5.0000 mg | INHALATION_SOLUTION | Freq: Once | RESPIRATORY_TRACT | Status: AC
  Administered 2016-12-19: 5 mg via RESPIRATORY_TRACT
  Filled 2016-12-19: qty 6

## 2016-12-19 MED ORDER — IPRATROPIUM BROMIDE 0.02 % IN SOLN
0.5000 mg | Freq: Once | RESPIRATORY_TRACT | Status: AC
  Administered 2016-12-19: 0.5 mg via RESPIRATORY_TRACT
  Filled 2016-12-19: qty 2.5

## 2016-12-19 MED ORDER — ALBUTEROL SULFATE (2.5 MG/3ML) 0.083% IN NEBU
5.0000 mg | INHALATION_SOLUTION | Freq: Once | RESPIRATORY_TRACT | Status: AC
  Administered 2016-12-19: 5 mg via RESPIRATORY_TRACT

## 2016-12-19 MED ORDER — PREDNISONE 20 MG PO TABS: ORAL_TABLET | ORAL | 0 refills | Status: DC

## 2016-12-19 MED ORDER — ALBUTEROL SULFATE HFA 108 (90 BASE) MCG/ACT IN AERS
2.0000 | INHALATION_SPRAY | Freq: Once | RESPIRATORY_TRACT | Status: AC
  Administered 2016-12-19: 2 via RESPIRATORY_TRACT
  Filled 2016-12-19: qty 6.7

## 2016-12-19 MED ORDER — PREDNISONE 20 MG PO TABS
60.0000 mg | ORAL_TABLET | Freq: Once | ORAL | Status: AC
  Administered 2016-12-19: 60 mg via ORAL
  Filled 2016-12-19: qty 3

## 2016-12-19 MED ORDER — ALBUTEROL SULFATE HFA 108 (90 BASE) MCG/ACT IN AERS: 2.0000 | INHALATION_SPRAY | RESPIRATORY_TRACT | 0 refills | Status: DC | PRN

## 2016-12-19 NOTE — ED Notes (Signed)
Got patient into gown on the monitor patient is resting with call bell in reach  

## 2016-12-19 NOTE — ED Provider Notes (Signed)
[facility]-EMERGENCY DEPT Provider Note   CSN: [id number] Arrival date & time: 12/19/16  7425     History   Chief Complaint Chief Complaint  Patient presents with  . Asthma  . Wheezing    HPI Henry Schroeder is a 31 y.o. male with a PMHx of asthma, who presents to the ED with complaints of asthma exacerbation x3 days with URI symptoms starting Saturday 3 days ago. Patient states he developed rhinorrhea and cough with yellow sputum production on Saturday, has been using DayQuil and TheraFlu with some relief of his symptoms, no known aggravating factors. He has had associated chest tightness, wheezing, and shortness of breath. He states this all feels very similar to his prior asthma exacerbations. Patient does not currently have a PCP, and states that his dog ate his inhaler today so he needs a refill. He denies any sick contacts, recent travel, or tobacco use. Chart review reveals that he was seen in the ED here on 11/16/16 for asthma exacerbation and was given an albuterol inhaler and discharged; he was also seen at [facility] ED on 12/05/16 for similar complaints and was also given a refill of his inhaler. Last year he had several ED visits for asthma exacerbations as well. He denies diaphoresis, lightheadedness, fevers, chills, ear pain/drainage, sore throat, hemoptysis, CP, LE swelling, recent travel/surgery/immobilization, personal/family hx of DVT/PE, abd pain, N/V/D/C, hematuria, dysuria, myalgias, arthralgias, claudication, orthopnea, numbness, tingling, focal weakness, or any other complaints at this time.    The history is provided by the patient and medical records. No language interpreter was used.  Asthma  This is a recurrent problem. The current episode started more than 2 days ago. The problem occurs constantly. The problem has not changed since onset.Associated symptoms include shortness of breath. Pertinent negatives include no chest pain and no abdominal pain. Nothing aggravates the  symptoms. The symptoms are relieved by medications. Treatments tried: theraflu and dayquil. The treatment provided mild relief.  Wheezing   Associated symptoms include rhinorrhea and cough. Pertinent negatives include no chest pain, no fever, no abdominal pain, no vomiting, no diarrhea, no dysuria, no ear pain and no sore throat. His past medical history is significant for asthma.    Past Medical History:  Diagnosis Date  . Asthma     There are no active problems to display for this patient.   History reviewed. No pertinent surgical history.     Home Medications    Prior to Admission medications   Medication Sig Start Date End Date Taking? Authorizing Provider  albuterol (PROVENTIL HFA;VENTOLIN HFA) 108 (90 Base) MCG/ACT inhaler Inhale 2 puffs into the lungs every 4 (four) hours as needed for wheezing or shortness of breath. 10/05/15   Cheri Fowler, PA-C  predniSONE (DELTASONE) 20 MG tablet Take 1 tablet (20 mg total) by mouth 2 (two) times daily with a meal. 09/10/14   Rolland Porter, MD    Family History History reviewed. No pertinent family history.  Social History Social History  Substance Use Topics  . Smoking status: Current Every Day Smoker    Types: Cigarettes    Last attempt to quit: 02/15/2013  . Smokeless tobacco: Never Used  . Alcohol use No     Allergies   Shellfish allergy and Onion   Review of Systems Review of Systems  Constitutional: Negative for chills, diaphoresis and fever.  HENT: Positive for rhinorrhea. Negative for ear discharge, ear pain and sore throat.   Respiratory: Positive for cough, chest tightness, shortness of breath and  wheezing.   Cardiovascular: Negative for chest pain and leg swelling.  Gastrointestinal: Negative for abdominal pain, constipation, diarrhea, nausea and vomiting.  Genitourinary: Negative for dysuria and hematuria.  Musculoskeletal: Negative for arthralgias and myalgias.  Skin: Negative for color change.    Allergic/Immunologic: Negative for immunocompromised state.  Neurological: Negative for weakness, light-headedness and numbness.  Psychiatric/Behavioral: Negative for confusion.   All other systems reviewed and are negative for acute change except as noted in the HPI.    Physical Exam Updated Vital Signs BP (!) 128/96   Pulse 64   Temp 98.3 F (36.8 C) (Oral)   Resp 20   SpO2 98%   Physical Exam  Constitutional: He is oriented to person, place, and time. Vital signs are normal. He appears well-developed and well-nourished.  Non-toxic appearance. No distress.  Afebrile, nontoxic, NAD  HENT:  Head: Normocephalic and atraumatic.  Nose: Mucosal edema present.  Mouth/Throat: Uvula is midline, oropharynx is clear and moist and mucous membranes are normal. No trismus in the jaw. No uvula swelling. Tonsils are 0 on the right. Tonsils are 0 on the left. No tonsillar exudate.  Mild nasal congestion  Eyes: Conjunctivae and EOM are normal. Right eye exhibits no discharge. Left eye exhibits no discharge.  Neck: Normal range of motion. Neck supple.  Cardiovascular: Normal rate, regular rhythm, normal heart sounds and intact distal pulses.  Exam reveals no gallop and no friction rub.   No murmur heard. RRR, nl s1/s2, no m/r/g, distal pulses intact, no pedal edema   Pulmonary/Chest: Effort normal. No respiratory distress. He has decreased breath sounds. He has wheezes. He has no rhonchi. He has no rales.  Globally diminished air movement throughout all lung fields, diffuse faint wheezing throughout, no rhonchi/rales, no hypoxia or increased WOB, speaking in full sentences, SpO2 98% on RA   Abdominal: Soft. Normal appearance and bowel sounds are normal. He exhibits no distension. There is no tenderness. There is no rigidity, no rebound, no guarding, no CVA tenderness, no tenderness at McBurney's point and negative Murphy's sign.  Musculoskeletal: Normal range of motion.  Neurological: He is alert  and oriented to person, place, and time. He has normal strength. No sensory deficit.  Skin: Skin is warm, dry and intact. No rash noted.  Psychiatric: He has a normal mood and affect.  Nursing note and vitals reviewed.    ED Treatments / Results  Labs (all labs ordered are listed, but only abnormal results are displayed) Labs Reviewed - No data to display  EKG  EKG Interpretation  Date/Time:  Monday December 19 2016 09:05:07 EDT Ventricular Rate:  76 PR Interval:  130 QRS Duration: 78 QT Interval:  362 QTC Calculation: 407 R Axis:   78 Text Interpretation:  Normal sinus rhythm with sinus arrhythmia ST elevation, consider early repolarization Borderline ECG Confirmed by Raeford Razor [phone]) on 12/19/2016 1:03:12 PM       Radiology Dg Chest 2 View  Result Date: 12/19/2016 CLINICAL DATA:  Shortness of breath, wheezing, cough EXAM: CHEST  2 VIEW COMPARISON:  07/15/2014 FINDINGS: Mild peribronchial thickening. Heart and mediastinal contours are within normal limits. No focal opacities or effusions. No acute bony abnormality. IMPRESSION: Mild bronchitic changes. Electronically Signed   By: Charlett Nose M.D.   On: 12/19/2016 09:40    Procedures Procedures (including critical care time)  Medications Ordered in ED Medications  albuterol (PROVENTIL HFA;VENTOLIN HFA) 108 (90 Base) MCG/ACT inhaler 2 puff (not administered)  albuterol (PROVENTIL) (2.5 MG/3ML) 0.083% nebulizer solution  5 mg (5 mg Nebulization Not Given 12/19/16 0908)  albuterol (PROVENTIL) (2.5 MG/3ML) 0.083% nebulizer solution 5 mg (5 mg Nebulization Given 12/19/16 1256)  ipratropium (ATROVENT) nebulizer solution 0.5 mg (0.5 mg Nebulization Given 12/19/16 1256)  predniSONE (DELTASONE) tablet 60 mg (60 mg Oral Given 12/19/16 1255)  albuterol (PROVENTIL) (2.5 MG/3ML) 0.083% nebulizer solution 5 mg (5 mg Nebulization Given 12/19/16 1530)  ipratropium (ATROVENT) nebulizer solution 0.5 mg (0.5 mg Nebulization Given 12/19/16  1530)     Initial Impression / Assessment and Plan / ED Course  I have reviewed the triage vital signs and the nursing notes.  Pertinent labs & imaging results that were available during my care of the patient were reviewed by me and considered in my medical decision making (see chart for details).     32 y.o. male here with asthma exacerbation and URI symptoms x3 days. States feels like prior asthma exacerbations. No PCP at this time, dog ate his last inhaler today. On exam, faint wheezing diffusely throughout, globally diminished air movement in all lung fields, no rhonchi/rales, no tachycardia or hypoxia, speaking in full sentences, in NAD. EKG in triage nonischemic, CXR showing mild bronchitic changes. Will give duoneb and prednisone, doubt need for labs at this time, but if we need more breathing treatments may consider getting labs at that time. Will reassess shortly  3:24 PM Pt feeling much better and lung sounds moderately improved after duoneb; still slightly diminished air movement in lower fields bilaterally; no ongoing wheezing and no rhonchi/rales. Will give one more duoneb then reassess; if improved air movement then we can likely d/c home. Will reassess shortly.  4:26 PM Pt feeling much better and lung sounds greatly improved after second breathing tx; ambulatory without desat. Will send home with inhaler and refill rx, prednisone x4 days starting tomorrow, advised use of daily antihistamine, and other OTC remedies for symptomatic relief. Doubt need for abx despite mild bronchitic findings on CXR, these are likely just from his asthma/viral illness. Advised f/up with [facility] in 5-7 days for recheck of symptoms and to establish medical care. I explained the diagnosis and have given explicit precautions to return to the ER including for any other new or worsening symptoms. The patient understands and accepts the medical plan as it's been dictated and I have answered their questions.  Discharge instructions concerning home care and prescriptions have been given. The patient is STABLE and is discharged to home in good condition.    Final Clinical Impressions(s) / ED Diagnoses   Final diagnoses:  Mild persistent asthma with exacerbation  Bronchitis  Shortness of breath    New Prescriptions New Prescriptions   ALBUTEROL (PROVENTIL HFA;VENTOLIN HFA) 108 (90 BASE) MCG/ACT INHALER    Inhale 2 puffs into the lungs every 4 (four) hours as needed for wheezing or shortness of breath (cough).   PREDNISONE (DELTASONE) 20 MG TABLET    3 tabs po daily x 4 days STARTING 12/20/16     Emonnie Cannady, [place], [place] 12/19/16 1627    Raeford Razor, MD 12/20/16 1406

## 2016-12-19 NOTE — ED Triage Notes (Signed)
Pt to ER with c/o shortness of breath and chest pain r/t asthma. Pt lungs bilaterally diminished with expiratory wheezing. NAD at this time, states is out his inhalers and does not have a PCP. O2 97% on RA. States has been congested all weekend with URI.

## 2016-12-19 NOTE — ED Notes (Signed)
PA at bedside.

## 2016-12-19 NOTE — Discharge Instructions (Signed)
Continue to stay well-hydrated. Use Mucinex for cough suppression/expectoration of mucus. Use over the counter antihistamines such as claritin, zyrtec, or allegra EVERY DAY to decrease symptoms and frequency of asthma attacks. Use inhaler as directed, as needed for cough/chest congestion/wheezing/shortness of breath/etc. Take prednisone as directed for your asthma exacerbation, starting tomorrow since you received today's dose in the ER today. Followup with the [place] in 5-7 days for recheck of ongoing symptoms and to establish medical care. Return to emergency department for emergent changing or worsening of symptoms.

## 2016-12-19 NOTE — ED Notes (Signed)
Patient able to ambulate independently  

## 2018-02-10 ENCOUNTER — Emergency Department (HOSPITAL_COMMUNITY)
Admission: EM | Admit: 2018-02-10 | Discharge: 2018-02-10 | Disposition: A | Discharge status: Home / Self Care | Payer: Self-pay | Attending: Emergency Medicine | Admitting: Emergency Medicine

## 2018-02-10 ENCOUNTER — Encounter (HOSPITAL_COMMUNITY): Payer: Self-pay

## 2018-02-10 ENCOUNTER — Encounter (HOSPITAL_COMMUNITY): Payer: Self-pay | Admitting: *Deleted

## 2018-02-10 ENCOUNTER — Other Ambulatory Visit: Payer: Self-pay

## 2018-02-10 DIAGNOSIS — F17210 Nicotine dependence, cigarettes, uncomplicated: Secondary | ICD-10-CM | POA: Insufficient documentation

## 2018-02-10 DIAGNOSIS — J4521 Mild intermittent asthma with (acute) exacerbation: Secondary | ICD-10-CM | POA: Insufficient documentation

## 2018-02-10 MED ORDER — ALBUTEROL SULFATE (2.5 MG/3ML) 0.083% IN NEBU
2.5000 mg | INHALATION_SOLUTION | Freq: Once | RESPIRATORY_TRACT | Status: AC
  Administered 2018-02-10: 2.5 mg via RESPIRATORY_TRACT
  Filled 2018-02-10: qty 3

## 2018-02-10 MED ORDER — PREDNISONE 10 MG PO TABS: 50.0000 mg | ORAL_TABLET | Freq: Every day | ORAL | 0 refills | Status: AC

## 2018-02-10 MED ORDER — PREDNISONE 20 MG PO TABS
60.0000 mg | ORAL_TABLET | Freq: Once | ORAL | Status: AC
  Administered 2018-02-10: 60 mg via ORAL
  Filled 2018-02-10: qty 3

## 2018-02-10 MED ORDER — ALBUTEROL SULFATE HFA 108 (90 BASE) MCG/ACT IN AERS
2.0000 | INHALATION_SPRAY | Freq: Once | RESPIRATORY_TRACT | Status: AC
  Administered 2018-02-10: 2 via RESPIRATORY_TRACT
  Filled 2018-02-10: qty 6.7

## 2018-02-10 MED ORDER — IPRATROPIUM-ALBUTEROL 0.5-2.5 (3) MG/3ML IN SOLN
3.0000 mL | Freq: Once | RESPIRATORY_TRACT | Status: AC
  Administered 2018-02-10: 3 mL via RESPIRATORY_TRACT
  Filled 2018-02-10: qty 3

## 2018-02-10 NOTE — ED Triage Notes (Signed)
Pt rechecks in or SOB after receiving nebulizer treatment and 2 puffs on inhaler.  Pt was ambulatory at discharge without any complaints.  Pt speaking in full sentences without labored breathing.

## 2018-02-10 NOTE — ED Provider Notes (Signed)
[facility]   CSN: [id number] Arrival date & time: 02/10/18  2004     History   Chief Complaint Chief Complaint  Patient presents with  . Shortness of Breath    HPI Christen Bamedrian Marcantel is a 32 y.o. male.  32 year old male just seen for asthma exacerbation returns.  Patient states that he walked outside and the cold air aggravated his asthma symptoms again.  Patient used the inhaler given to him tonight in the ER and checked back in.  Patient states this time he feels fine and denies any complaints.     Past Medical History:  Diagnosis Date  . Asthma     There are no active problems to display for this patient.   History reviewed. No pertinent surgical history.      Home Medications    Prior to Admission medications   Medication Sig Start Date End Date Taking? Authorizing Provider  albuterol (PROVENTIL HFA;VENTOLIN HFA) 108 (90 Base) MCG/ACT inhaler Inhale 2 puffs into the lungs every 4 (four) hours as needed for wheezing or shortness of breath (cough). 12/19/16   Street, Mercedes, PA-C  predniSONE (DELTASONE) 10 MG tablet Take 5 tablets (50 mg total) by mouth daily for 4 days. 02/10/18 02/14/18  Jeannie FendMurphy,  A, PA-C    Family History History reviewed. No pertinent family history.  Social History Social History   Tobacco Use  . Smoking status: Current Every Day Smoker    Types: Cigarettes    Last attempt to quit: 02/15/2013    Years since quitting: 4.9  . Smokeless tobacco: Never Used  Substance Use Topics  . Alcohol use: No  . Drug use: No     Allergies   Shellfish allergy and Onion   Review of Systems Review of Systems  Constitutional: Negative for fever.  Respiratory: Negative for cough, chest tightness, shortness of breath and wheezing.   Cardiovascular: Negative for chest pain.  Skin: Negative for rash and wound.  Allergic/Immunologic: Negative for immunocompromised state.  All other systems  reviewed and are negative.    Physical Exam Updated Vital Signs BP 132/80 (BP Location: Right Arm)   Pulse 98   Temp 98.4 F (36.9 C) (Oral)   Resp 18   SpO2 96%   Physical Exam  Constitutional: He is oriented to person, place, and time. He appears well-developed and well-nourished. No distress.  HENT:  Head: Normocephalic and atraumatic.  Cardiovascular: Normal rate, regular rhythm and normal heart sounds.  Pulmonary/Chest: Effort normal. He has decreased breath sounds. He has wheezes.  Diffuse inspiratory and expiratory wheezes with diminished breath sounds.  Neurological: He is alert and oriented to person, place, and time.  Skin: He is not diaphoretic.  Psychiatric: He has a normal mood and affect. His behavior is normal.  Nursing note and vitals reviewed.    ED Treatments / Results  Labs (all labs ordered are listed, but only abnormal results are displayed) Labs Reviewed - No data to display  EKG None  Radiology No results found.  Procedures Procedures (including critical care time)  Medications Ordered in ED Medications  ipratropium-albuterol (DUONEB) 0.5-2.5 (3) MG/3ML nebulizer solution 3 mL (3 mLs Nebulization Given 02/10/18 2057)     Initial Impression / Assessment and Plan / ED Course  I have reviewed the triage vital signs and the nursing notes.  Pertinent labs & imaging results that were available during my care of the patient were reviewed by me and considered in my medical  decision making (see chart for details).  Clinical Course as of Feb 11 2216  Sat Feb 10, 2018  6410 32 year old male with history of asthma returns to the ER just after discharge for return of his asthma symptoms.  On exam patient has diminished breath sounds with inspiratory respiratory wheezing.  Patient states that he does not have any symptoms or complaints at this time.  Patient walked back outside to see if the cold air would bother his symptoms again and returns to his  room and states he would like to have another breathing treatment.  DuoNeb ordered.  Patient will be discharged after treatment if feeling well.  Vital signs unremarkable.  Patient is able to speak in complete sentences without any distress, ambulates to the room and return to the room with 100% room air sats.   [LM]    Clinical Course User Index [LM] Jeannie Fend, PA-C   Final Clinical Impressions(s) / ED Diagnoses   Final diagnoses:  Mild intermittent asthma with exacerbation    ED Discharge Orders    None       Alden Hipp 02/10/18 2217    Maia Plan, MD 02/11/18 [phone]

## 2018-02-10 NOTE — ED Notes (Signed)
Pt stable, ambulatory, states understanding of discharge instructions 

## 2018-02-10 NOTE — ED Notes (Signed)
Pt was able to walk outside and come back to room to test his toleration to the cold which he thinks is the cause of his SOB.

## 2018-02-10 NOTE — ED Provider Notes (Signed)
[facility] EMERGENCY DEPARTMENT Provider Note   CSN: [id number] Arrival date & time: 02/10/18  1821     History   Chief Complaint Chief Complaint  Patient presents with  . Asthma    HPI Henry Schroeder is a 32 y.o. male.  32 year old male presents with complaint of asthma exacerbation.  Patient states that he has not had to use an inhaler for the past 2 years and is not sure where his inhaler is currently.  Reports asthma exacerbation starting yesterday, worse today.  Symptoms have improved since he has been sitting in the ER reading.  Denies fevers, chills, sick symptoms.  Reports shortness of breath with wheezing and nonproductive cough.  No other complaints or concerns.     Past Medical History:  Diagnosis Date  . Asthma     There are no active problems to display for this patient.   History reviewed. No pertinent surgical history.      Home Medications    Prior to Admission medications   Medication Sig Start Date End Date Taking? Authorizing Provider  albuterol (PROVENTIL HFA;VENTOLIN HFA) 108 (90 Base) MCG/ACT inhaler Inhale 2 puffs into the lungs every 4 (four) hours as needed for wheezing or shortness of breath (cough). 12/19/16   Street, Mercedes, PA-C  predniSONE (DELTASONE) 10 MG tablet Take 5 tablets (50 mg total) by mouth daily for 4 days. 02/10/18 02/14/18  Jeannie Fend, PA-C    Family History No family history on file.  Social History Social History   Tobacco Use  . Smoking status: Current Every Day Smoker    Types: Cigarettes    Last attempt to quit: 02/15/2013    Years since quitting: 4.9  . Smokeless tobacco: Never Used  Substance Use Topics  . Alcohol use: No  . Drug use: No     Allergies   Shellfish allergy and Onion   Review of Systems Review of Systems  Constitutional: Negative for chills and fever.  HENT: Negative for congestion, rhinorrhea, sinus pressure, sinus pain, sneezing and sore throat.     Respiratory: Positive for cough, shortness of breath and wheezing. Negative for chest tightness.   Cardiovascular: Negative for chest pain.  Skin: Negative for rash and wound.  Allergic/Immunologic: Negative for immunocompromised state.  Neurological: Negative for weakness and headaches.  Hematological: Negative for adenopathy.  Psychiatric/Behavioral: Negative for confusion.  All other systems reviewed and are negative.    Physical Exam Updated Vital Signs BP (!) 147/88   Pulse 98   Temp 98.5 F (36.9 C) (Oral)   Resp 18   Ht 5\' 8"  (1.727 m)   Wt 82.6 kg   SpO2 97%   BMI 27.67 kg/m   Physical Exam  Constitutional: He is oriented to person, place, and time. He appears well-developed and well-nourished. No distress.  HENT:  Head: Normocephalic and atraumatic.  Nose: Nose normal.  Mouth/Throat: Oropharynx is clear and moist. No oropharyngeal exudate.  Eyes: Conjunctivae are normal.  Neck: Neck supple.  Cardiovascular: Normal rate, regular rhythm, normal heart sounds and intact distal pulses.  No murmur heard. Pulmonary/Chest: Effort normal. No respiratory distress. He has decreased breath sounds. He has wheezes.  Neurological: He is alert and oriented to person, place, and time.  Skin: Skin is warm and dry. No rash noted. He is not diaphoretic.  Psychiatric: He has a normal mood and affect. His behavior is normal.  Nursing note and vitals reviewed.    ED Treatments / Results  Labs (all  labs ordered are listed, but only abnormal results are displayed) Labs Reviewed - No data to display  EKG None  Radiology No results found.  Procedures Procedures (including critical care time)  Medications Ordered in ED Medications  albuterol (PROVENTIL) (2.5 MG/3ML) 0.083% nebulizer solution 2.5 mg (2.5 mg Nebulization Given 02/10/18 1858)  predniSONE (DELTASONE) tablet 60 mg (60 mg Oral Given 02/10/18 1857)  albuterol (PROVENTIL HFA;VENTOLIN HFA) 108 (90 Base) MCG/ACT  inhaler 2 puff (2 puffs Inhalation Given 02/10/18 1953)     Initial Impression / Assessment and Plan / ED Course  I have reviewed the triage vital signs and the nursing notes.  Pertinent labs & imaging results that were available during my care of the patient were reviewed by me and considered in my medical decision making (see chart for details).  Clinical Course as of Feb 11 1952  Sat Feb 10, 2018  1949 32yo male with asthma exacerbation, improving after steroids and neb x 1. Patient requests inhaler incase symptoms return tonight. Patient given inhaler with 2 puffs prior to discharge. Rx for prednisone sent to pharmacy to start tomorrow. Follow up with PCP, return to ER for any worsening or concerning symptoms.    [LM]    Clinical Course User Index [LM] Jeannie FendMurphy,  A, PA-C   Final Clinical Impressions(s) / ED Diagnoses   Final diagnoses:  Mild intermittent asthma with exacerbation    ED Discharge Orders         Ordered    predniSONE (DELTASONE) 10 MG tablet  Daily     02/10/18 1944           Jeannie FendMurphy,  A, PA-C 02/10/18 1954    Tilden Fossaees, Elizabeth, MD 02/11/18 1053

## 2018-02-10 NOTE — ED Triage Notes (Signed)
The pt has asthma and he has not had an inhaler for awhile.  For the past 2-3 days his breathing has become more difficult  Worse today.  No audible wheezes no respiratory distress

## 2018-02-10 NOTE — ED Notes (Signed)
Pt seen to be walking out "to check the air"

## 2018-02-10 NOTE — Discharge Instructions (Addendum)
Take Prednisone as prescribed and complete the full course. Use inhaler as needed, 1-2 puffs every 4-6 hours.

## 2018-02-15 ENCOUNTER — Emergency Department (HOSPITAL_COMMUNITY)
Admission: EM | Admit: 2018-02-15 | Discharge: 2018-02-16 | Disposition: A | Discharge status: Home / Self Care | Payer: Self-pay | Attending: Emergency Medicine | Admitting: Emergency Medicine

## 2018-02-15 ENCOUNTER — Encounter (HOSPITAL_COMMUNITY): Payer: Self-pay

## 2018-02-15 ENCOUNTER — Other Ambulatory Visit: Payer: Self-pay

## 2018-02-15 ENCOUNTER — Emergency Department (HOSPITAL_COMMUNITY): Payer: Self-pay

## 2018-02-15 DIAGNOSIS — J45901 Unspecified asthma with (acute) exacerbation: Secondary | ICD-10-CM | POA: Insufficient documentation

## 2018-02-15 DIAGNOSIS — F17210 Nicotine dependence, cigarettes, uncomplicated: Secondary | ICD-10-CM | POA: Insufficient documentation

## 2018-02-15 LAB — CBC WITH DIFFERENTIAL/PLATELET
Abs Immature Granulocytes: 0.07 10*3/uL (ref 0.00–0.07)
BASOS PCT: 1 %
Basophils Absolute: 0.1 10*3/uL (ref 0.0–0.1)
EOS ABS: 0.6 10*3/uL — AB (ref 0.0–0.5)
Eosinophils Relative: 4 %
HEMATOCRIT: 54.2 % — AB (ref 39.0–52.0)
HEMOGLOBIN: 17 g/dL (ref 13.0–17.0)
Immature Granulocytes: 1 %
Lymphocytes Relative: 33 %
Lymphs Abs: 5 10*3/uL — ABNORMAL HIGH (ref 0.7–4.0)
MCH: 26.6 pg (ref 26.0–34.0)
MCHC: 31.4 g/dL (ref 30.0–36.0)
MCV: 84.7 fL (ref 80.0–100.0)
Monocytes Absolute: 1.8 10*3/uL — ABNORMAL HIGH (ref 0.1–1.0)
Monocytes Relative: 12 %
NEUTROS PCT: 49 %
NRBC: 0 % (ref 0.0–0.2)
Neutro Abs: 7.7 10*3/uL (ref 1.7–7.7)
Platelets: 278 10*3/uL (ref 150–400)
RBC: 6.4 MIL/uL — ABNORMAL HIGH (ref 4.22–5.81)
RDW: 13.8 % (ref 11.5–15.5)
WBC: 15.3 10*3/uL — AB (ref 4.0–10.5)

## 2018-02-15 LAB — I-STAT CHEM 8, ED
BUN: 10 mg/dL (ref 6–20)
CALCIUM ION: 1.21 mmol/L (ref 1.15–1.40)
CREATININE: 0.9 mg/dL (ref 0.61–1.24)
Chloride: 105 mmol/L (ref 98–111)
Glucose, Bld: 104 mg/dL — ABNORMAL HIGH (ref 70–99)
HCT: 54 % — ABNORMAL HIGH (ref 39.0–52.0)
Hemoglobin: 18.4 g/dL — ABNORMAL HIGH (ref 13.0–17.0)
Potassium: 3.8 mmol/L (ref 3.5–5.1)
SODIUM: 143 mmol/L (ref 135–145)
TCO2: 32 mmol/L (ref 22–32)

## 2018-02-15 MED ORDER — SODIUM CHLORIDE 0.9 % IV BOLUS
1000.0000 mL | Freq: Once | INTRAVENOUS | Status: AC
  Administered 2018-02-16: 1000 mL via INTRAVENOUS

## 2018-02-15 MED ORDER — ALBUTEROL (5 MG/ML) CONTINUOUS INHALATION SOLN
10.0000 mg/h | INHALATION_SOLUTION | Freq: Once | RESPIRATORY_TRACT | Status: AC
  Administered 2018-02-15: 10 mg/h via RESPIRATORY_TRACT
  Filled 2018-02-15: qty 20

## 2018-02-15 MED ORDER — MAGNESIUM SULFATE 2 GM/50ML IV SOLN
2.0000 g | Freq: Once | INTRAVENOUS | Status: AC
  Administered 2018-02-15: 2 g via INTRAVENOUS
  Filled 2018-02-15: qty 50

## 2018-02-15 NOTE — ED Provider Notes (Signed)
[facility] EMERGENCY [facility] Provider Note   CSN: [id number] Arrival date & time: 02/15/18  2114     History   Chief Complaint Chief Complaint  Patient presents with  . Shortness of Breath    HPI Henry Schroeder is a 32 y.o. male. Level 5 caveat due to respiratory distress. HPI Patient presents with shortness of breath.  Presents in obvious respiratory distress.  Diaphoretic.  Had had 2 breathing treatments and Solu-Medrol by EMS.  Seen in the ER around 5 days ago for URI symptoms.  Had been on steroids but states he did not get any better.  Now states he is having a very difficult time breathing. Past Medical History:  Diagnosis Date  . Asthma     There are no active problems to display for this patient.   History reviewed. No pertinent surgical history.      Home Medications    Prior to Admission medications   Medication Sig Start Date End Date Taking? Authorizing Provider  albuterol (PROVENTIL HFA;VENTOLIN HFA) 108 (90 Base) MCG/ACT inhaler Inhale 2 puffs into the lungs every 4 (four) hours as needed for wheezing or shortness of breath (cough). 12/19/16  Yes Street, [place], PA-C    Family History History reviewed. No pertinent family history.  Social History Social History   Tobacco Use  . Smoking status: Current Every Day Smoker    Types: Cigarettes    Last attempt to quit: 02/15/2013    Years since quitting: 5.0  . Smokeless tobacco: Never Used  Substance Use Topics  . Alcohol use: No  . Drug use: No     Allergies   Shellfish allergy and Onion   Review of Systems Review of Systems  Unable to perform ROS: Severe respiratory distress  Constitutional: Positive for diaphoresis. Negative for appetite change.  Respiratory: Positive for shortness of breath and wheezing.   Cardiovascular: Negative for chest pain.  Gastrointestinal: Negative for abdominal pain.  Genitourinary: Negative for flank pain.     Physical  Exam Updated Vital Signs BP 124/87   Pulse (!) 111   Resp 17   Ht 5\' 8"  (1.727 m)   Wt 82.6 kg   SpO2 100%   BMI 27.67 kg/m   Physical Exam  Constitutional: He appears distressed.  Diaphoretic.  HENT:  Head: Normocephalic.  Eyes: Pupils are equal, round, and reactive to light.  Neck: Neck supple.  Cardiovascular:  Tachycardia  Pulmonary/Chest: Effort normal.  Abdominal: Soft. There is no tenderness.  Musculoskeletal:       Right lower leg: He exhibits no edema.       Left lower leg: He exhibits no edema.  Neurological: He is alert.  Skin: Skin is warm. Capillary refill takes less than 2 seconds.     ED Treatments / Results  Labs (all labs ordered are listed, but only abnormal results are displayed) Labs Reviewed  CBC WITH DIFFERENTIAL/PLATELET - Abnormal; Notable for the following components:      Result Value   WBC 15.3 (*)    RBC 6.40 (*)    HCT 54.2 (*)    Lymphs Abs 5.0 (*)    Monocytes Absolute 1.8 (*)    Eosinophils Absolute 0.6 (*)    All other components within normal limits  I-STAT CHEM 8, ED - Abnormal; Notable for the following components:   Glucose, Bld 104 (*)    Hemoglobin 18.4 (*)    HCT 54.0 (*)    All other components within normal limits  EKG EKG Interpretation  Date/Time:  Thursday February 15 2018 21:23:11 EST Ventricular Rate:  111 PR Interval:    QRS Duration: 91 QT Interval:  322 QTC Calculation: 438 R Axis:   72 Text Interpretation:  Sinus tachycardia Probable anteroseptal infarct, old Artifact in lead(s) II aVR aVF V1 V2 V3 V4 V5 V6 and baseline wander in lead(s) II III aVR aVF V4 V5 Confirmed by Benjiman Core [phone]) on 02/15/2018 10:36:07 PM   Radiology Dg Chest Portable 1 View  Result Date: 02/15/2018 CLINICAL DATA:  Dyspnea EXAM: PORTABLE CHEST 1 VIEW COMPARISON:  12/19/2016 FINDINGS: Lungs are slightly hyperinflated without pulmonary consolidation or edema. No effusion or pneumothorax. Heart size is top-normal.  Nonaneurysmal thoracic aorta is noted. No acute nor suspicious osseous abnormalities. IMPRESSION: No active disease. Electronically Signed   By: Tollie Eth M.D.   On: 02/15/2018 22:04    Procedures Procedures (including critical care time)  Medications Ordered in ED Medications  albuterol (PROVENTIL,VENTOLIN) solution continuous neb (10 mg/hr Nebulization Given 02/15/18 2146)  magnesium sulfate IVPB 2 g 50 mL (0 g Intravenous Stopped 02/15/18 2140)     Initial Impression / Assessment and Plan / ED Course  I have reviewed the triage vital signs and the nursing notes.  Pertinent labs & imaging results that were available during my care of the patient were reviewed by me and considered in my medical decision making (see chart for details).    You patient presented initially in a story distress.  History of asthma.  Diaphoretic dyspnea and working to breathe.  Borderline oxygenation.  Started on BiPAP and given magnesium.  Had already had Solu-Medrol.  After BiPAP patient is feeling much better.  Had also continuous nebulizer through the BiPAP.   Will try without BiPAP.  Lungs much clear.  If tolerates likely be able to discharge home with steroids although may require taper since we are now getting more towards 10 days of prednisone.  Care turned over to Dr. Erma Heritage.  CRITICAL CARE Performed by: Benjiman Core Total critical care time: 30 minutes Critical care time was exclusive of separately billable procedures and treating other patients. Critical care was necessary to treat or prevent imminent or life-threatening deterioration. Critical care was time spent personally by me on the following activities: development of treatment plan with patient and/or surrogate as well as nursing, discussions with consultants, evaluation of patient's response to treatment, examination of patient, obtaining history from patient or surrogate, ordering and performing treatments and interventions, ordering  and review of laboratory studies, ordering and review of radiographic studies, pulse oximetry and re-evaluation of patient's condition.   Final Clinical Impressions(s) / ED Diagnoses   Final diagnoses:  Exacerbation of asthma, unspecified asthma severity, unspecified whether persistent    ED Discharge Orders    None       Benjiman Core, MD 02/15/18 2338

## 2018-02-15 NOTE — Progress Notes (Signed)
Pt placed on bipap with a continuous neb inline. Pt tolerating BIPAP and treatment well. BIPAP setting of 12/6, 40% with sats of 100%. RT will continue to monitor.

## 2018-02-15 NOTE — ED Notes (Signed)
Pt placed on BiPAP per MD Pickering d/t work of breathing. Pt remains anxious, however agreeable to plan

## 2018-02-15 NOTE — ED Triage Notes (Signed)
Pt BIB [facility] for eval of shortness of breath. Pt has been seen previously for asthma exacerbations, however pt reports "it's never been this bad". Pt arrives w/ resp rate of 40, profusely diaphoretic, extremely anxious. Pt speaking in 4-5 word sentences. Arrives receiving duoneb.

## 2018-02-16 MED ORDER — AEROCHAMBER PLUS FLO-VU LARGE MISC
1.0000 | Freq: Once | Status: AC
  Administered 2018-02-16: 1

## 2018-02-16 MED ORDER — PREDNISONE 10 MG PO TABS: ORAL_TABLET | ORAL | 0 refills | Status: AC

## 2018-02-16 MED ORDER — ALBUTEROL SULFATE (2.5 MG/3ML) 0.083% IN NEBU: 2.5000 mg | INHALATION_SOLUTION | Freq: Four times a day (QID) | RESPIRATORY_TRACT | 0 refills | Status: DC | PRN

## 2018-02-16 MED ORDER — PREDNISONE 20 MG PO TABS
60.0000 mg | ORAL_TABLET | Freq: Once | ORAL | Status: AC
  Administered 2018-02-16: 60 mg via ORAL
  Filled 2018-02-16: qty 3

## 2018-02-16 MED ORDER — ALBUTEROL SULFATE HFA 108 (90 BASE) MCG/ACT IN AERS: 2.0000 | INHALATION_SPRAY | Freq: Once | RESPIRATORY_TRACT | Status: DC

## 2018-02-16 MED ORDER — ALBUTEROL SULFATE HFA 108 (90 BASE) MCG/ACT IN AERS
4.0000 | INHALATION_SPRAY | Freq: Once | RESPIRATORY_TRACT | Status: AC
  Administered 2018-02-16: 4 via RESPIRATORY_TRACT
  Filled 2018-02-16: qty 6.7

## 2018-02-16 NOTE — ED Provider Notes (Signed)
32 year old male here with asthma exacerbation.  Patient initially with extreme increased work of breathing and briefly placed on BiPAP.  After nebulizers, patient has had complete resolution of wheezing and has been stable in the ED.  The patient was monitored for over 4 hours off of BiPAP without any breathing treatments and he remains completely clear with no wheezing.  He has normal work of breathing.  He was ambulated around the ED with no dyspnea or difficulty breathing and O2 sats greater than 92% on room air.  I discussed and initially recommended admission given patient's initial presentation, but based on shared decision making patient would like to attempt outpatient management.  Of note, he was using expired, pediatric dose albuterol that he had obtained from his significant other's son.  I suspect that this could have been contributing to his initial presentation as well as his dramatic reversal with only several breathing treatments.  I have given him a new inhaler here with a spacer which she will use every 4 hours.  Have also given him a new prescription for a nebulizer.  Will also give him a longer taper off of prednisone and good return precautions.   Shaune PollackIsaacs, Jodilyn Giese, MD 02/16/18 [phone]

## 2018-02-16 NOTE — Discharge Instructions (Signed)
It is very important that you use your inhaler as prescribed.  For the next 24 hours, use the inhaler every 4 hours to keep your lungs open.  It is critically important that you do this to prevent having to come back to the hospital.  I have also prescribed you a new, adult prescription for albuterol nebulizer solution, which she can use as well.  We have provided new prescription for steroids.  I would start these today.  It is important to continue these as prescribed.

## 2018-02-16 NOTE — ED Notes (Signed)
Pt ambulated in hall with no distress.  Sats remained in the 93 to 95% range on room air.

## 2020-01-18 ENCOUNTER — Emergency Department (HOSPITAL_BASED_OUTPATIENT_CLINIC_OR_DEPARTMENT_OTHER): Payer: Self-pay

## 2020-01-18 ENCOUNTER — Encounter (HOSPITAL_BASED_OUTPATIENT_CLINIC_OR_DEPARTMENT_OTHER): Payer: Self-pay | Admitting: Emergency Medicine

## 2020-01-18 ENCOUNTER — Other Ambulatory Visit: Payer: Self-pay

## 2020-01-18 ENCOUNTER — Emergency Department (HOSPITAL_BASED_OUTPATIENT_CLINIC_OR_DEPARTMENT_OTHER)
Admission: EM | Admit: 2020-01-18 | Discharge: 2020-01-18 | Disposition: A | Discharge status: Home / Self Care | Payer: Self-pay | Attending: Emergency Medicine | Admitting: Emergency Medicine

## 2020-01-18 DIAGNOSIS — R109 Unspecified abdominal pain: Secondary | ICD-10-CM | POA: Insufficient documentation

## 2020-01-18 DIAGNOSIS — J90 Pleural effusion, not elsewhere classified: Secondary | ICD-10-CM | POA: Insufficient documentation

## 2020-01-18 DIAGNOSIS — F17210 Nicotine dependence, cigarettes, uncomplicated: Secondary | ICD-10-CM | POA: Insufficient documentation

## 2020-01-18 DIAGNOSIS — J45909 Unspecified asthma, uncomplicated: Secondary | ICD-10-CM | POA: Insufficient documentation

## 2020-01-18 LAB — URINALYSIS, ROUTINE W REFLEX MICROSCOPIC
Bilirubin Urine: NEGATIVE
Glucose, UA: NEGATIVE mg/dL
Ketones, ur: NEGATIVE mg/dL
Nitrite: NEGATIVE
Protein, ur: NEGATIVE mg/dL
Specific Gravity, Urine: <1.005 — ABNORMAL LOW (ref 1.005–1.030)
pH: 6.5 (ref 5.0–8.0)

## 2020-01-18 LAB — URINALYSIS, MICROSCOPIC (REFLEX): Squamous Epithelial / HPF: NONE SEEN (ref 0–5)

## 2020-01-18 MED ORDER — KETOROLAC TROMETHAMINE 30 MG/ML IJ SOLN
30.0000 mg | Freq: Once | INTRAMUSCULAR | Status: AC
  Administered 2020-01-18: 30 mg via INTRAMUSCULAR
  Filled 2020-01-18: qty 1

## 2020-01-18 MED ORDER — ALBUTEROL SULFATE HFA 108 (90 BASE) MCG/ACT IN AERS: 2.0000 | INHALATION_SPRAY | RESPIRATORY_TRACT | 0 refills | Status: DC | PRN

## 2020-01-18 MED ORDER — DICLOFENAC SODIUM 1 % EX GEL: 2.0000 g | Freq: Four times a day (QID) | CUTANEOUS | 0 refills | Status: DC | PRN

## 2020-01-18 MED ORDER — IBUPROFEN 800 MG PO TABS: 800.0000 mg | ORAL_TABLET | Freq: Three times a day (TID) | ORAL | 0 refills | Status: DC | PRN

## 2020-01-18 MED ORDER — ALBUTEROL SULFATE (2.5 MG/3ML) 0.083% IN NEBU: 2.5000 mg | INHALATION_SOLUTION | Freq: Four times a day (QID) | RESPIRATORY_TRACT | 0 refills | Status: DC | PRN

## 2020-01-18 NOTE — Discharge Instructions (Addendum)
You were seen in the emergency department today with pain on the right side. I am calling in several medications for your pain to take as directed. I have also refilled your albuterol inhaler and nebulizer solution. You have a very small collection of fluid at the base of your right lung which could be causing your pain. Please establish care with a primary care doctor listed by calling the office first thing Monday morning. They can help to get you on insurance and arrange further follow-up for the fluid on your lung. This often resolves on its own but if it does not could require further testing.  We are sending your urine for testing and will call you if you need to start antibiotics for this.  Return to the emergency department with worsening pain, shortness of breath, abdominal pain, fever, or other sudden worsening symptoms.

## 2020-01-18 NOTE — ED Triage Notes (Signed)
R flank pain x 3 days, worse with movement.

## 2020-01-18 NOTE — ED Provider Notes (Signed)
Emergency Department Provider Note   I have reviewed the triage vital signs and the nursing notes.   HISTORY  Chief Complaint Flank Pain   HPI Henry Schroeder is a 34 y.o. male with PMH of asthma presents to the emergency department with 3 days of right flank pain.  Patient describes moderate to occasionally severe pain in the right flank which limits his movement.  The pain is worse with movement.  He denies any injury.  He is not having dysuria or hematuria that he can appreciate.  He is not having fevers or chills.  When he is lying down the pain does move somewhat anterior in the lower.  No vomiting or diarrhea.  No sick contacts.  No prior surgical history of his abdomen.  No radiation of pain symptoms up into the chest.  No history of kidney stones.   Past Medical History:  Diagnosis Date  . Asthma     There are no problems to display for this patient.   History reviewed. No pertinent surgical history.  Allergies Shellfish allergy and Onion  No family history on file.  Social History Social History   Tobacco Use  . Smoking status: Current Every Day Smoker    Types: Cigarettes    Last attempt to quit: 02/15/2013    Years since quitting: 6.9  . Smokeless tobacco: Never Used  Substance Use Topics  . Alcohol use: No  . Drug use: No    Review of Systems  Constitutional: No fever/chills Eyes: No visual changes. ENT: No sore throat. Cardiovascular: Denies chest pain. Respiratory: Denies shortness of breath. Gastrointestinal: Positive right flank/abdominal pain.  No nausea, no vomiting.  No diarrhea.  No constipation. Genitourinary: Negative for dysuria. Musculoskeletal: Negative for back pain. Skin: Negative for rash. Neurological: Negative for headaches, focal weakness or numbness.  10-point ROS otherwise negative.  ____________________________________________   PHYSICAL EXAM:  VITAL SIGNS: ED Triage Vitals  Enc Vitals Group     BP 01/18/20 0706  112/78     Pulse Rate 01/18/20 0706 87     Resp 01/18/20 0706 18     Temp 01/18/20 0706 99.4 F (37.4 C)     Temp Source 01/18/20 0706 Oral     SpO2 01/18/20 0706 97 %     Weight 01/18/20 0703 180 lb (81.6 kg)     Height 01/18/20 0703 5\' 8"  (1.727 m)   Constitutional: Alert and oriented. Well appearing and in no acute distress. Eyes: Conjunctivae are normal.  Head: Atraumatic. Nose: No congestion/rhinnorhea. Mouth/Throat: Mucous membranes are moist.   Neck: No stridor.   Cardiovascular: Normal rate, regular rhythm.  Respiratory: Normal respiratory effort.   Gastrointestinal: Soft with mild right sided tenderness but no focal peritonitis. No distention. No CVA tenderness.  Musculoskeletal: No gross deformities of extremities. Neurologic:  Normal speech and language.  Skin:  Skin is warm, dry and intact. No rash noted.  ____________________________________________   LABS (all labs ordered are listed, but only abnormal results are displayed)  Labs Reviewed  URINALYSIS, ROUTINE W REFLEX MICROSCOPIC - Abnormal; Notable for the following components:      Result Value   Color, Urine STRAW (*)    Specific Gravity, Urine <1.005 (*)    Hgb urine dipstick TRACE (*)    Leukocytes,Ua SMALL (*)    All other components within normal limits  URINALYSIS, MICROSCOPIC (REFLEX) - Abnormal; Notable for the following components:   Bacteria, UA FEW (*)    Non Squamous Epithelial PRESENT (*)  All other components within normal limits  URINE CULTURE   ____________________________________________  RADIOLOGY  CT Renal Stone Study  Result Date: 01/18/2020 CLINICAL DATA:  Flank pain. EXAM: CT ABDOMEN AND PELVIS WITHOUT CONTRAST TECHNIQUE: Multidetector CT imaging of the abdomen and pelvis was performed following the standard protocol without IV contrast. COMPARISON:  None. FINDINGS: Lower chest: Trace RIGHT pleural effusion versus pleural thickening. Hepatobiliary: No focal liver abnormality is  seen. No gallstones, gallbladder wall thickening, or biliary dilatation. Pancreas: Unremarkable. No pancreatic ductal dilatation or surrounding inflammatory changes. Spleen: Normal in size without focal abnormality. Adrenals/Urinary Tract: Adrenal glands appear normal. Kidneys are unremarkable without stone or hydronephrosis. No perinephric fluid. No ureteral or bladder calculi are identified. Stomach/Bowel: No dilated large or small bowel loops. No evidence of bowel wall inflammation. Moderate amount of stool and gas throughout the colon. Appendix is normal. Stomach is unremarkable, partially decompressed. Vascular/Lymphatic: No significant vascular findings are present. No enlarged abdominal or pelvic lymph nodes. Reproductive: Prostate is unremarkable. Other: No free fluid or abscess collection. No free intraperitoneal air. Musculoskeletal: No acute or suspicious osseous finding. Scoliosis of the thoracolumbar spine. IMPRESSION: 1. Trace RIGHT pleural effusion versus pleural thickening. 2. No acute findings within the abdomen or pelvis. No renal or ureteral calculi. No bowel obstruction or evidence of bowel wall inflammation. Appendix is normal. 3. Scoliosis of the thoracolumbar spine. Electronically Signed   By: Bary Richard M.D.   On: 01/18/2020 08:01    ____________________________________________   PROCEDURES  Procedure(s) performed:   Procedures  None  ____________________________________________   INITIAL IMPRESSION / ASSESSMENT AND PLAN / ED COURSE  Pertinent labs & imaging results that were available during my care of the patient were reviewed by me and considered in my medical decision making (see chart for details).   Patient presents to the emergency department with flank pain for the past 3 days.  No focal tenderness on abdominal exam.  Suspicion for ureterolithiasis is somewhat elevated.  Could also be musculoskeletal.  No appreciable rash to suspect developing zoster infection.   Plan for Toradol, CT renal, UA, and reassess.  My suspicion for underlying cholecystitis or appendicitis is low based on my abdominal exam and history.  08:20 AM  CT imaging and UA reviewed. Patient has a small right-sided pleural effusion which may be causing his discomfort given the location. No radiographic evidence to suspect loculated effusion or abscess. No other findings in the abdomen to attribute to his symptoms. Patient's UA is equivocal for UTI. He is not having significant dysuria, hesitancy, urgency. Doubt pyelonephritis in this case. Plan to hold on antibiotics and follow urine culture with an alternate explanation for the patient's discomfort. He does not have a PCP and I have referred him to the [facility] health and wellness clinic. He will call the office on Monday for the first available appointment. I have also called in refills of his albuterol inhaler and nebulizer solution in addition to Motrin and Voltaren gel for symptom mgmt.  ____________________________________________  FINAL CLINICAL IMPRESSION(S) / ED DIAGNOSES  Final diagnoses:  Right flank pain  Pleural effusion     MEDICATIONS GIVEN DURING THIS VISIT:  Medications  ketorolac (TORADOL) 30 MG/ML injection 30 mg (30 mg Intramuscular Given 01/18/20 0723)     NEW OUTPATIENT MEDICATIONS STARTED DURING THIS VISIT:  New Prescriptions   DICLOFENAC SODIUM (VOLTAREN) 1 % GEL    Apply 2 g topically 4 (four) times daily as needed.   IBUPROFEN (ADVIL) 800 MG TABLET  Take 1 tablet (800 mg total) by mouth every 8 (eight) hours as needed.    Note:  This document was prepared using Dragon voice recognition software and may include unintentional dictation errors.  Alona Bene, MD, [facility]    Karina Lenderman, Arlyss Repress, MD 01/18/20 [phone]

## 2020-01-19 LAB — URINE CULTURE: Culture: NO GROWTH

## 2022-07-08 IMAGING — CT CT RENAL STONE PROTOCOL
2 of 4 series · 17 of 46 positions shown, 19 images · non-contrast
Comparison: None.

CLINICAL DATA: Flank pain.

EXAM:
CT ABDOMEN AND PELVIS WITHOUT CONTRAST
TECHNIQUE: Multidetector CT imaging of the abdomen and pelvis was performed
following the standard protocol without IV contrast.

[Series 2: axial st · axial · 0.83mm/px · z∈[-471,-46]mm · 14 of 93 slices shown, 16 images]
[im 4/93  soft-tissue]
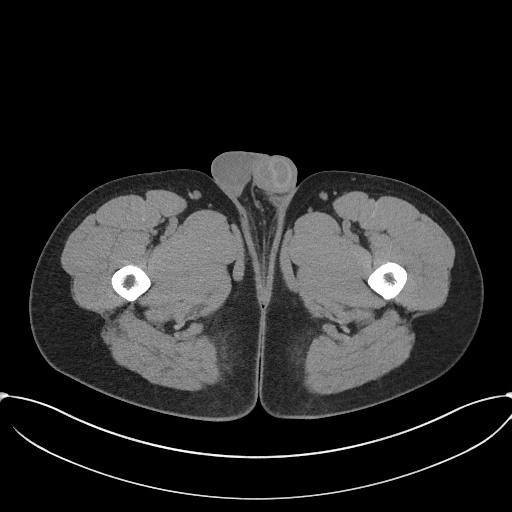
[im 4/93  bone]
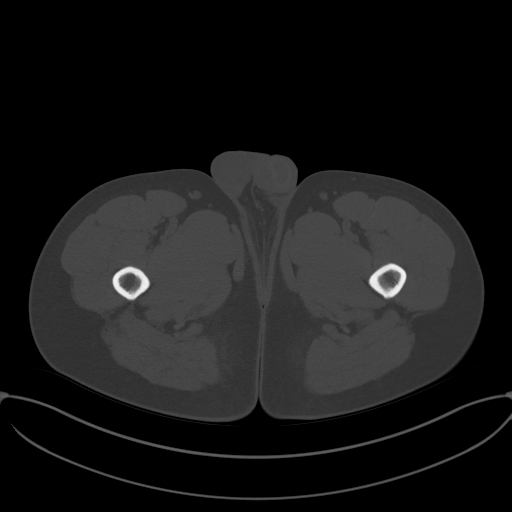
[im 12/93  soft-tissue]
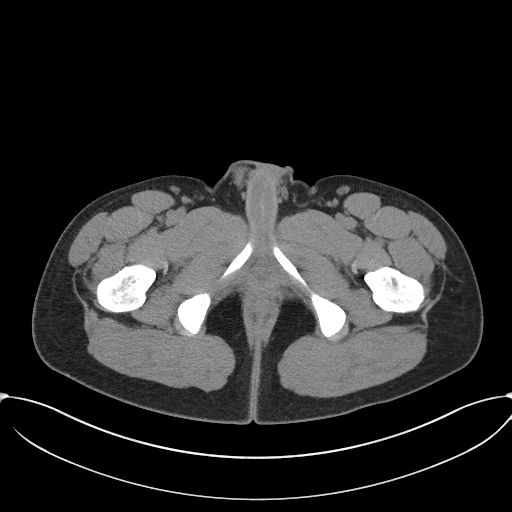
[im 19/93  soft-tissue]
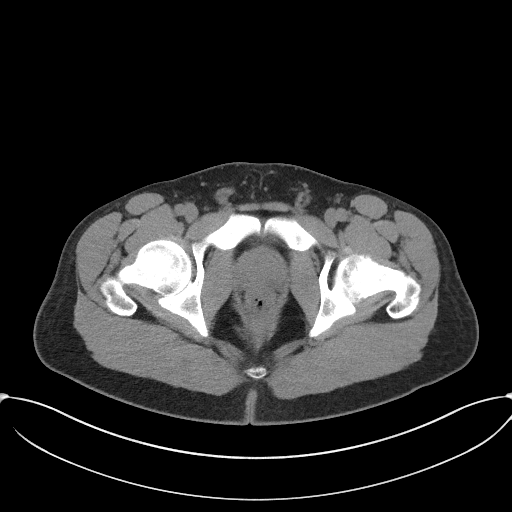
[im 26/93  soft-tissue]
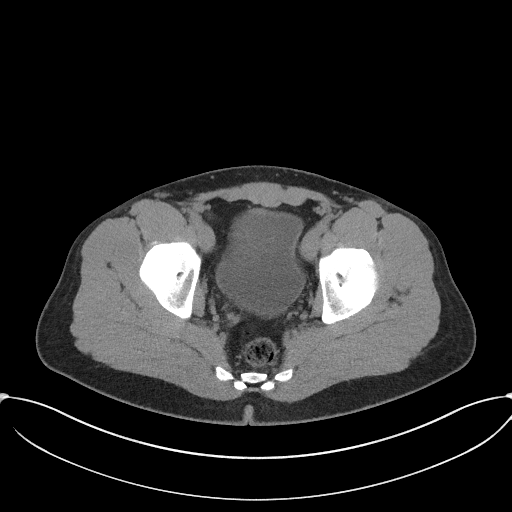
[im 30/93  soft-tissue]
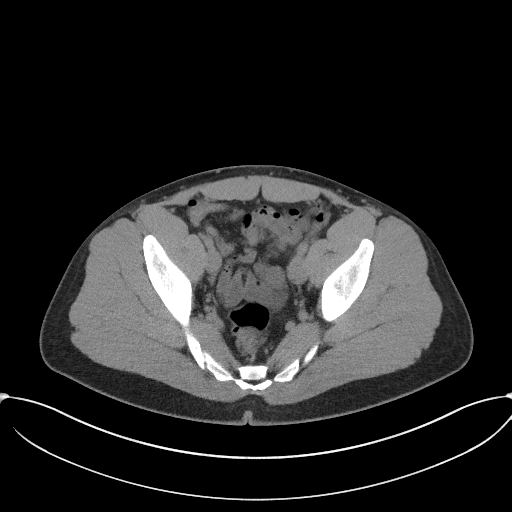
[im 37/93  soft-tissue]
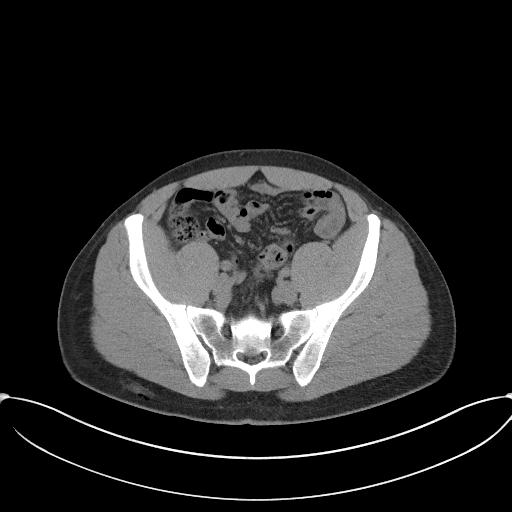
[im 45/93  soft-tissue]
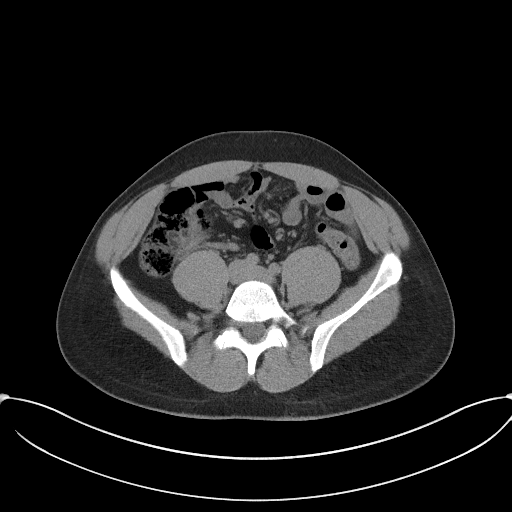
[im 48/93  soft-tissue]
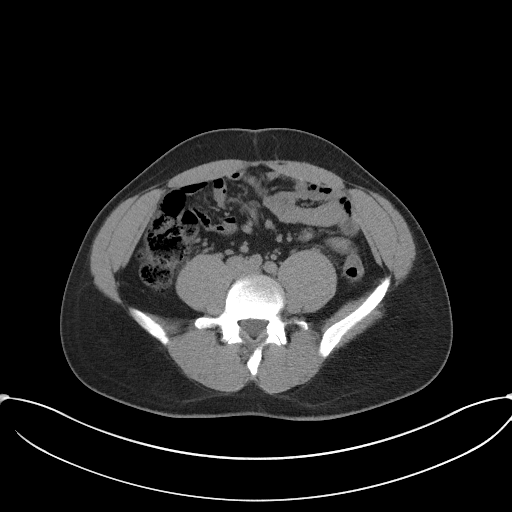
[im 56/93  soft-tissue]
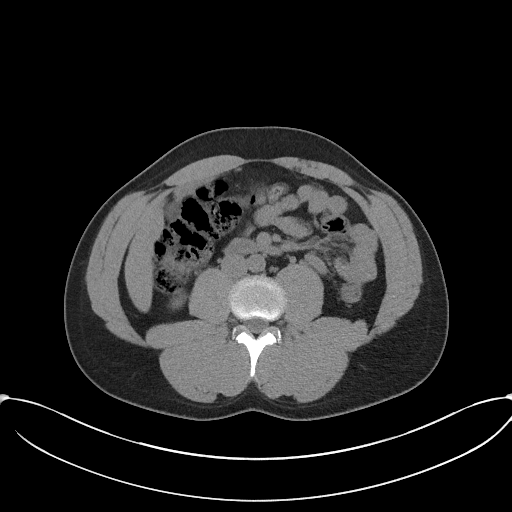
[im 56/93  bone]
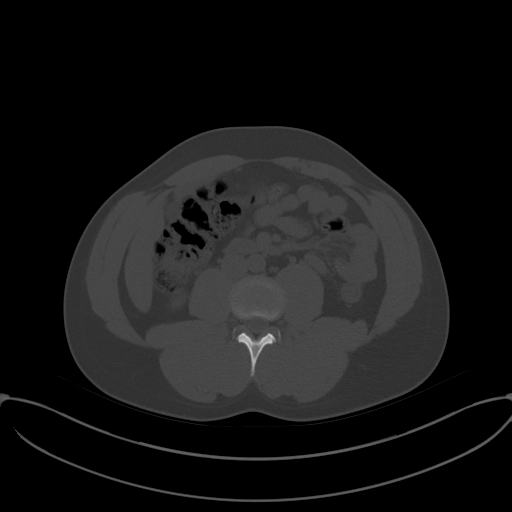
[im 63/93  soft-tissue]
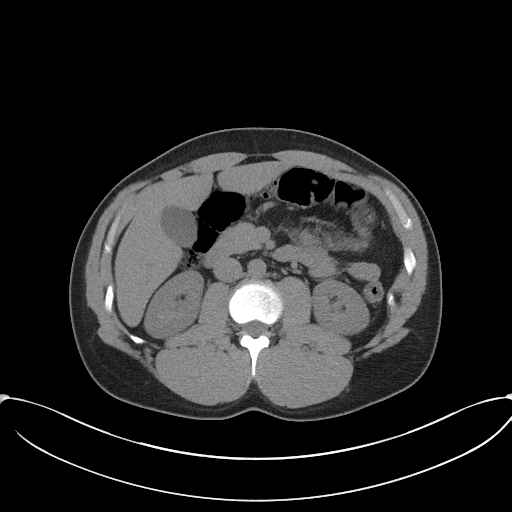
[im 70/93  soft-tissue]
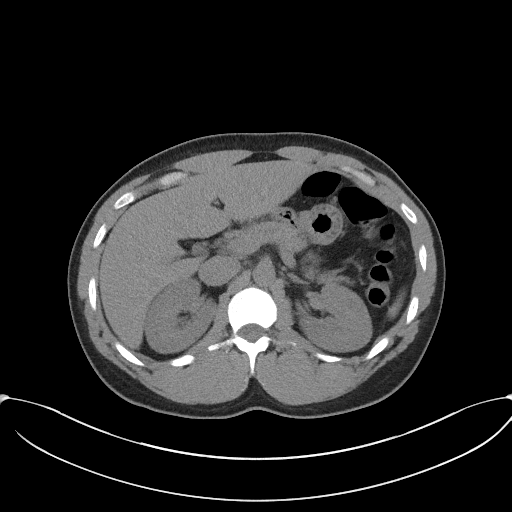
[im 74/93  soft-tissue]
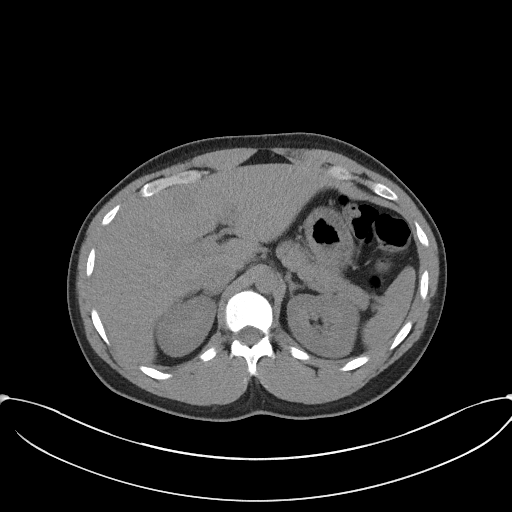
[im 81/93  soft-tissue]
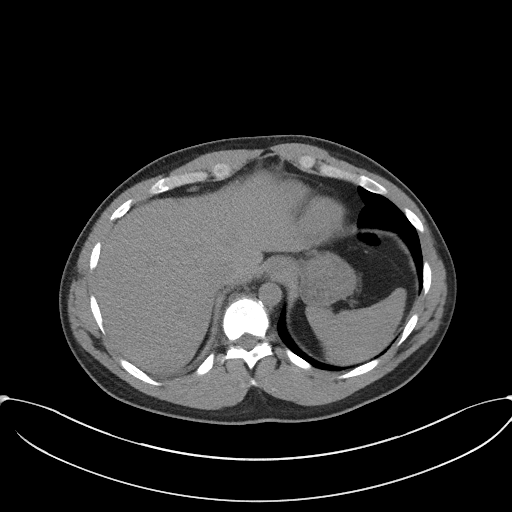
[im 89/93  soft-tissue]
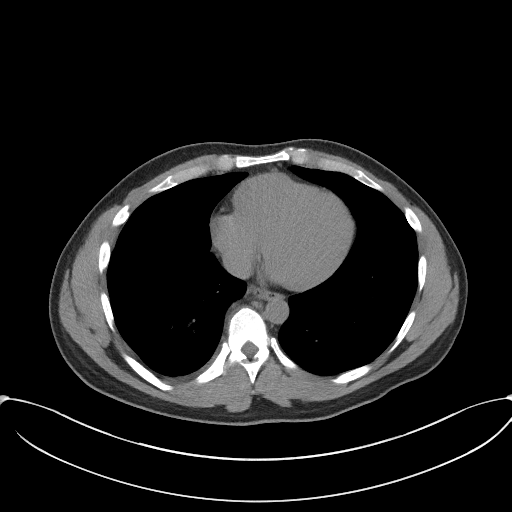

[Series 5: coronal st · coronal · 0.90mm/px · 3 of 99 slices shown]
[im 33/99  soft-tissue]
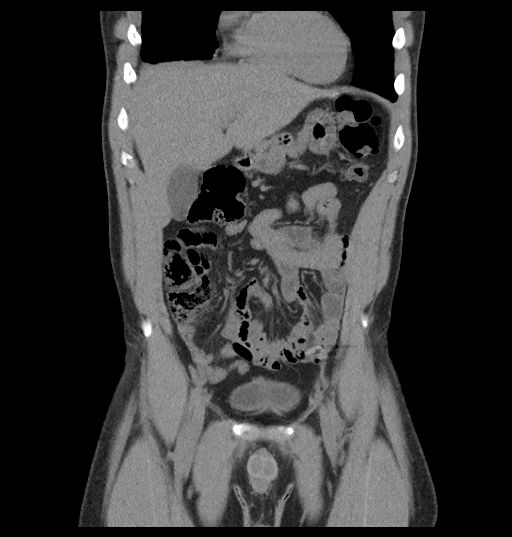
[im 44/99  soft-tissue]
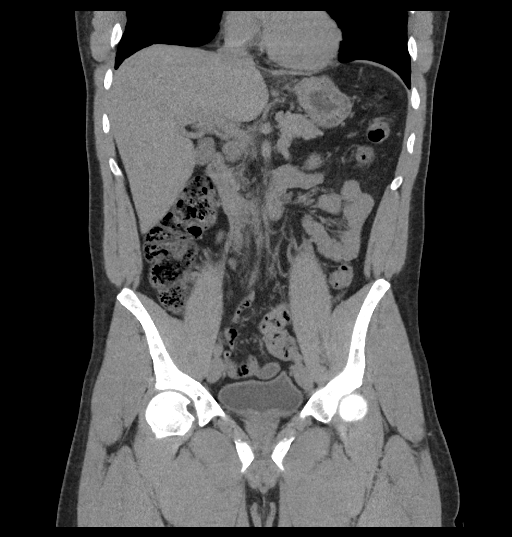
[im 55/99  soft-tissue]
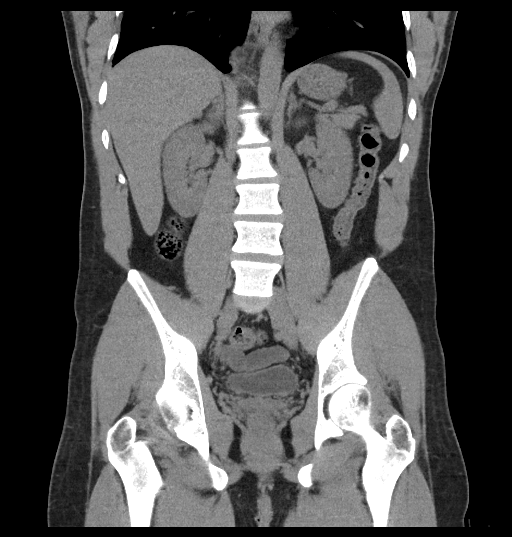

[17 of 46 positions shown; findings below may reference images not displayed]

FINDINGS: Lower chest: Trace RIGHT pleural effusion versus pleural thickening.

Hepatobiliary: No focal liver abnormality is seen. No gallstones,
gallbladder wall thickening, or biliary dilatation.

Pancreas: Unremarkable. No pancreatic ductal dilatation or
surrounding inflammatory changes.

Spleen: Normal in size without focal abnormality.

Adrenals/Urinary Tract: Adrenal glands appear normal. Kidneys are
unremarkable without stone or hydronephrosis. No perinephric fluid.
No ureteral or bladder calculi are identified.

Stomach/Bowel: No dilated large or small bowel loops. No evidence of
bowel wall inflammation. Moderate amount of stool and gas throughout
the colon. Appendix is normal. Stomach is unremarkable, partially
decompressed.

Vascular/Lymphatic: No significant vascular findings are present. No
enlarged abdominal or pelvic lymph nodes.

Reproductive: Prostate is unremarkable.

Other: No free fluid or abscess collection. No free intraperitoneal
air.

Musculoskeletal: No acute or suspicious osseous finding. Scoliosis
of the thoracolumbar spine.
IMPRESSION: 1. Trace RIGHT pleural effusion versus pleural thickening.
2. No acute findings within the abdomen or pelvis. No renal or
ureteral calculi. No bowel obstruction or evidence of bowel wall
inflammation. Appendix is normal.
3. Scoliosis of the thoracolumbar spine.

## 2023-11-30 ENCOUNTER — Ambulatory Visit
Admission: RE | Admit: 2023-11-30 | Discharge: 2023-11-30 | Disposition: A | Discharge status: Home / Self Care | Payer: Self-pay | Source: Ambulatory Visit | Attending: Family Medicine | Admitting: Family Medicine

## 2023-11-30 VITALS — BP 132/89 | HR 83 | Temp 98.8°F | Resp 20

## 2023-11-30 DIAGNOSIS — Z113 Encounter for screening for infections with a predominantly sexual mode of transmission: Secondary | ICD-10-CM

## 2023-11-30 DIAGNOSIS — Z202 Contact with and (suspected) exposure to infections with a predominantly sexual mode of transmission: Secondary | ICD-10-CM

## 2023-11-30 MED ORDER — DOXYCYCLINE HYCLATE 100 MG PO CAPS: 100.0000 mg | ORAL_CAPSULE | Freq: Two times a day (BID) | ORAL | 0 refills | Status: DC

## 2023-11-30 NOTE — Discharge Instructions (Addendum)
 Avoid all forms of sexual intercourse (oral, vaginal, anal) for the next 7 days to avoid spreading/reinfecting or at least until we can see what kinds of infection results are positive.  Abstaining for 2 weeks would be better but at least 1 week is required.  We will let you know about your test results from the swab we did today and if you need any prescriptions for antibiotics or changes to your treatment from today.

## 2023-11-30 NOTE — ED Provider Notes (Signed)
 [facility]RG[facility]  Note:  This document was prepared using Conservation officer, historic buildings and may include unintentional dictation errors.  MRN: [id number] DOB: March 24, 1986  Subjective:   Henry Schroeder is a 38 y.o. male presenting for std screening.  Had exposure to chlamydia and would like treatment.  Does not want HIV and syphilis testing.  Denies dysuria, hematuria, urinary frequency, penile discharge, penile swelling, testicular pain, testicular swelling, anal pain, groin pain.   No current facility-administered medications for this encounter.  Current Outpatient Medications:    albuterol  (PROVENTIL ) (2.5 MG/3ML) 0.083% nebulizer solution, Take 3 mLs (2.5 mg total) by nebulization every 6 (six) hours as needed for wheezing or shortness of breath., Disp: 75 mL, Rfl: 0   albuterol  (VENTOLIN  HFA) 108 (90 Base) MCG/ACT inhaler, Inhale 2 puffs into the lungs every 4 (four) hours as needed for wheezing or shortness of breath (cough)., Disp: 6.7 g, Rfl: 0   diclofenac  Sodium (VOLTAREN ) 1 % GEL, Apply 2 g topically 4 (four) times daily as needed., Disp: 50 g, Rfl: 0   ibuprofen  (ADVIL ) 800 MG tablet, Take 1 tablet (800 mg total) by mouth every 8 (eight) hours as needed., Disp: 21 tablet, Rfl: 0   Allergies  Allergen Reactions   Shellfish Allergy Anaphylaxis and Swelling   Onion Itching    Past Medical History:  Diagnosis Date   Asthma      History reviewed. No pertinent surgical history.  No family history on file.  Social History   Tobacco Use   Smoking status: Every Day    Current packs/day: 0.00    Types: Cigarettes    Last attempt to quit: 02/15/2013    Years since quitting: 10.7   Smokeless tobacco: Never  Vaping Use   Vaping status: Never Used  Substance Use Topics   Alcohol use: Yes    Comment: weekly   Drug use: No    ROS   Objective:   Vitals: BP 132/89 (BP Location: Right Arm)   Pulse 83   Temp 98.8 F (37.1 C) (Oral)   Resp 20    SpO2 97%   Physical Exam Constitutional:      General: He is not in acute distress.    Appearance: Normal appearance. He is well-developed and normal weight. He is not ill-appearing, toxic-appearing or diaphoretic.  HENT:     Head: Normocephalic and atraumatic.     Right Ear: External ear normal.     Left Ear: External ear normal.     Nose: Nose normal.     Mouth/Throat:     Pharynx: Oropharynx is clear.  Eyes:     General: No scleral icterus.       Right eye: No discharge.        Left eye: No discharge.     Extraocular Movements: Extraocular movements intact.  Cardiovascular:     Rate and Rhythm: Normal rate.  Pulmonary:     Effort: Pulmonary effort is normal.  Genitourinary:    Penis: Circumcised. Discharge present. No phimosis, paraphimosis, hypospadias, erythema, tenderness, swelling or lesions.   Musculoskeletal:     Cervical back: Normal range of motion.  Neurological:     Mental Status: He is alert and oriented to person, place, and time.  Psychiatric:        Mood and Affect: Mood normal.        Behavior: Behavior normal.        Thought Content: Thought content normal.  Judgment: Judgment normal.     Assessment and Plan :   PDMP not reviewed this encounter.  1. Exposure to chlamydia   2. Screen for STD (sexually transmitted disease)    Patient treated empirically as per CDC guidelines with doxycycline  as an outpatient.  Labs pending.   Counseled on safe sex practices including abstaining for 1 week following treatment.  Counseled patient on potential for adverse effects with medications prescribed/recommended today, ER and return-to-clinic precautions discussed, patient verbalized understanding.    Christopher Savannah, [place] 11/30/23 [phone]

## 2023-11-30 NOTE — ED Triage Notes (Signed)
 Pt requesting STD testing-reports +chlamydia exposure-pt denies sx-NAD-steady gait

## 2023-12-01 LAB — CYTOLOGY, (ORAL, ANAL, URETHRAL) ANCILLARY ONLY
Chlamydia: NEGATIVE
Comment: NEGATIVE
Comment: NEGATIVE
Comment: NORMAL
Neisseria Gonorrhea: NEGATIVE
Trichomonas: NEGATIVE

## 2024-01-07 ENCOUNTER — Other Ambulatory Visit: Payer: Self-pay

## 2024-01-07 ENCOUNTER — Emergency Department (HOSPITAL_BASED_OUTPATIENT_CLINIC_OR_DEPARTMENT_OTHER): Payer: Self-pay

## 2024-01-07 ENCOUNTER — Encounter (HOSPITAL_BASED_OUTPATIENT_CLINIC_OR_DEPARTMENT_OTHER): Payer: Self-pay

## 2024-01-07 ENCOUNTER — Emergency Department (HOSPITAL_BASED_OUTPATIENT_CLINIC_OR_DEPARTMENT_OTHER)
Admission: EM | Admit: 2024-01-07 | Discharge: 2024-01-07 | Disposition: A | Discharge status: Home / Self Care | Payer: Self-pay | Attending: Emergency Medicine | Admitting: Emergency Medicine

## 2024-01-07 DIAGNOSIS — S93402A Sprain of unspecified ligament of left ankle, initial encounter: Secondary | ICD-10-CM | POA: Insufficient documentation

## 2024-01-07 DIAGNOSIS — W108XXA Fall (on) (from) other stairs and steps, initial encounter: Secondary | ICD-10-CM | POA: Insufficient documentation

## 2024-01-07 MED ORDER — KETOROLAC TROMETHAMINE 15 MG/ML IJ SOLN
15.0000 mg | Freq: Once | INTRAMUSCULAR | Status: AC
  Administered 2024-01-07: 15 mg via INTRAMUSCULAR
  Filled 2024-01-07: qty 1

## 2024-01-07 MED ORDER — ACETAMINOPHEN 500 MG PO TABS
1000.0000 mg | ORAL_TABLET | Freq: Once | ORAL | Status: AC
  Administered 2024-01-07: 1000 mg via ORAL
  Filled 2024-01-07: qty 2

## 2024-01-07 MED ORDER — MORPHINE SULFATE 15 MG PO TABS: 7.5000 mg | ORAL_TABLET | ORAL | 0 refills | Status: DC | PRN

## 2024-01-07 MED ORDER — OXYCODONE HCL 5 MG PO TABS
5.0000 mg | ORAL_TABLET | Freq: Once | ORAL | Status: AC
  Administered 2024-01-07: 5 mg via ORAL
  Filled 2024-01-07: qty 1

## 2024-01-07 NOTE — ED Provider Notes (Signed)
 [place] EMERGENCY DEPARTMENT AT [facility] Provider Note   CSN: [id number] Arrival date & time: 01/07/24  2001     Patient presents with: Foot Injury   Henry Schroeder is a 38 y.o. male.   38 yo M with a chief complaints of pain to the dorsum of his left foot.  He said that he was walking down the stairs and he missed a step and he hyper plantarflexed his foot and is complaining of pain to the dorsum of the foot.  He denies pain at the knee.  Has pain with range of motion.   Foot Injury      Prior to Admission medications   Medication Sig Start Date End Date Taking? Authorizing Provider  morphine (MSIR) 15 MG tablet Take 0.5 tablets (7.5 mg total) by mouth every 4 (four) hours as needed for severe pain (pain score 7-10). 01/07/24  Yes Emil Share, DO  albuterol  (PROVENTIL ) (2.5 MG/3ML) 0.083% nebulizer solution Take 3 mLs (2.5 mg total) by nebulization every 6 (six) hours as needed for wheezing or shortness of breath. 01/18/20   Long, Fonda MATSU, MD  albuterol  (VENTOLIN  HFA) 108 (90 Base) MCG/ACT inhaler Inhale 2 puffs into the lungs every 4 (four) hours as needed for wheezing or shortness of breath (cough). 01/18/20   Long, Joshua G, MD  diclofenac  Sodium (VOLTAREN ) 1 % GEL Apply 2 g topically 4 (four) times daily as needed. 01/18/20   Long, Fonda MATSU, MD  doxycycline  (VIBRAMYCIN ) 100 MG capsule Take 1 capsule (100 mg total) by mouth 2 (two) times daily. 11/30/23   Christopher Savannah, PA-C  ibuprofen  (ADVIL ) 800 MG tablet Take 1 tablet (800 mg total) by mouth every 8 (eight) hours as needed. 01/18/20   Long, Fonda MATSU, MD    Allergies: Shellfish allergy and Onion    Review of Systems  Updated Vital Signs BP 138/83   Pulse 94   Temp 98 F (36.7 C) (Oral)   Resp 16   Ht 5' 8 (1.727 m)   Wt 95.3 kg   SpO2 98%   BMI 31.93 kg/m   Physical Exam Vitals and nursing note reviewed.  Constitutional:      Appearance: He is well-developed.  HENT:     Head: Normocephalic and  atraumatic.  Eyes:     Pupils: Pupils are equal, round, and reactive to light.  Neck:     Vascular: No JVD.  Cardiovascular:     Rate and Rhythm: Normal rate and regular rhythm.     Heart sounds: No murmur heard.    No friction rub. No gallop.  Pulmonary:     Effort: No respiratory distress.     Breath sounds: No wheezing.  Abdominal:     General: There is no distension.     Tenderness: There is no abdominal tenderness. There is no guarding or rebound.  Musculoskeletal:        General: Normal range of motion.     Cervical back: Normal range of motion and neck supple.     Comments: No appreciable edema to the foot over the ankle.  Pulse and sensation intact distally.  Patient tells me it is too painful to plantarflex dorsiflex.  Achilles tendon appears to be intact.  Perhaps some laxity with posterior distraction of the foot to the ankle.  Skin:    Coloration: Skin is not pale.     Findings: No rash.  Neurological:     Mental Status: He is alert and oriented to  person, place, and time.  Psychiatric:        Behavior: Behavior normal.     (all labs ordered are listed, but only abnormal results are displayed) Labs Reviewed - No data to display  EKG: None  Radiology: DG Foot Complete Left Result Date: 01/07/2024 CLINICAL DATA:  Recent fall with foot pain, initial encounter EXAM: LEFT FOOT - COMPLETE 3+ VIEW COMPARISON:  None Available. FINDINGS: Tiny avulsion from the dorsal aspect of the navicular bone is noted. No other fracture is seen. No soft tissue abnormality is noted. IMPRESSION: Tiny avulsion from the dorsal aspect of the navicular bone. Electronically Signed   By: Oneil Devonshire M.D.   On: 01/07/2024 20:52   DG Ankle Complete Left Result Date: 01/07/2024 CLINICAL DATA:  Left ankle pain following fall, initial encounter EXAM: LEFT ANKLE COMPLETE - 3+ VIEW COMPARISON:  None Available. FINDINGS: Tiny avulsion from the dorsal aspect of the navicular is noted. No other focal  fracture is seen. No soft tissue abnormality is noted. IMPRESSION: Tiny avulsion from the dorsal aspect of the navicular bone. Electronically Signed   By: Oneil Devonshire M.D.   On: 01/07/2024 20:51     Procedures   Medications Ordered in the ED  acetaminophen  (TYLENOL ) tablet 1,000 mg (1,000 mg Oral Given 01/07/24 2029)  ketorolac  (TORADOL ) 15 MG/ML injection 15 mg (15 mg Intramuscular Given 01/07/24 2029)  oxyCODONE (Oxy IR/ROXICODONE) immediate release tablet 5 mg (5 mg Oral Given 01/07/24 2029)                                    Medical Decision Making Amount and/or Complexity of Data Reviewed Radiology: ordered.  Risk OTC drugs. Prescription drug management.   38 yo M with a chief complaints of left foot pain.  Patient had what sounds like a hyper plantarflexion injury.  Will obtain a plain film.  Plain film of the foot on my independent interpretation without obvious fracture.  Radiology read with likely avulsion from the navicular bone.  I think most consistent with a ankle sprain.  ASO.  Crutches.  Orthopedic follow-up.  9:35 PM:  I have discussed the diagnosis/risks/treatment options with the patient and family.  Evaluation and diagnostic testing in the emergency department does not suggest an emergent condition requiring admission or immediate intervention beyond what has been performed at this time.  They will follow up with PCP, ortho. We also discussed returning to the ED immediately if new or worsening sx occur. We discussed the sx which are most concerning (e.g., sudden worsening pain, fever, inability to tolerate by mouth) that necessitate immediate return. Medications administered to the patient during their visit and any new prescriptions provided to the patient are listed below.  Medications given during this visit Medications  acetaminophen  (TYLENOL ) tablet 1,000 mg (1,000 mg Oral Given 01/07/24 2029)  ketorolac  (TORADOL ) 15 MG/ML injection 15 mg (15 mg  Intramuscular Given 01/07/24 2029)  oxyCODONE (Oxy IR/ROXICODONE) immediate release tablet 5 mg (5 mg Oral Given 01/07/24 2029)     The patient appears reasonably screen and/or stabilized for discharge and I doubt any other medical condition or other [facility] requiring further screening, evaluation, or treatment in the ED at this time prior to discharge.         Final diagnoses:  Sprain of left ankle, unspecified ligament, initial encounter    ED Discharge Orders  Ordered    morphine (MSIR) 15 MG tablet  Every 4 hours PRN        01/07/24 2101               Jennalynn Rivard, DO 01/07/24 2135

## 2024-01-07 NOTE — ED Triage Notes (Signed)
 Pt to exam via wheelchair c/o LEFT foot pain 7/10 ache in nature resulting from a fall 5 hours prior to arrival. Pt states he missed bottom step and rolled his foot backwards. PT has no obvious deformity or swelling noted. VSS NAD PT on room air.

## 2024-01-07 NOTE — Discharge Instructions (Addendum)
 Please follow-up with your family doctor in the office.  I have also given you information to follow-up with the orthopedic doctor in clinic.  Try to keep your weight off of the foot as best you can.  Take 4 over the counter ibuprofen  tablets 3 times a day or 2 over-the-counter naproxen  tablets twice a day for pain. Also take tylenol  1000mg (2 extra strength) four times a day.   Then take the pain medicine if you feel like you need it. Narcotics do not help with the pain, they only make you care about it less.  You can become addicted to this, people may break into your house to steal it.  It will constipate you.  If you drive under the influence of this medicine you can get a DUI.

## 2024-02-08 ENCOUNTER — Encounter (HOSPITAL_BASED_OUTPATIENT_CLINIC_OR_DEPARTMENT_OTHER): Payer: Self-pay

## 2024-02-08 ENCOUNTER — Other Ambulatory Visit: Payer: Self-pay

## 2024-02-08 ENCOUNTER — Emergency Department (HOSPITAL_BASED_OUTPATIENT_CLINIC_OR_DEPARTMENT_OTHER)
Admission: EM | Admit: 2024-02-08 | Discharge: 2024-02-08 | Disposition: A | Discharge status: Home / Self Care | Payer: Self-pay | Attending: Emergency Medicine | Admitting: Emergency Medicine

## 2024-02-08 DIAGNOSIS — J4521 Mild intermittent asthma with (acute) exacerbation: Secondary | ICD-10-CM | POA: Insufficient documentation

## 2024-02-08 LAB — RESP PANEL BY RT-PCR (RSV, FLU A&B, COVID)  RVPGX2
Influenza A by PCR: NEGATIVE
Influenza B by PCR: NEGATIVE
Resp Syncytial Virus by PCR: NEGATIVE
SARS Coronavirus 2 by RT PCR: NEGATIVE

## 2024-02-08 MED ORDER — IPRATROPIUM-ALBUTEROL 0.5-2.5 (3) MG/3ML IN SOLN
3.0000 mL | Freq: Once | RESPIRATORY_TRACT | Status: AC
  Administered 2024-02-08: 3 mL via RESPIRATORY_TRACT
  Filled 2024-02-08: qty 3

## 2024-02-08 MED ORDER — PREDNISONE 20 MG PO TABS: 40.0000 mg | ORAL_TABLET | Freq: Every day | ORAL | 0 refills | Status: DC

## 2024-02-08 MED ORDER — IPRATROPIUM-ALBUTEROL 0.5-2.5 (3) MG/3ML IN SOLN
3.0000 mL | RESPIRATORY_TRACT | Status: AC
  Administered 2024-02-08 (×2): 3 mL via RESPIRATORY_TRACT
  Filled 2024-02-08: qty 6

## 2024-02-08 MED ORDER — DEXAMETHASONE 4 MG PO TABS
10.0000 mg | ORAL_TABLET | Freq: Once | ORAL | Status: AC
  Administered 2024-02-08: 10 mg via ORAL
  Filled 2024-02-08: qty 3

## 2024-02-08 MED ORDER — ALBUTEROL SULFATE HFA 108 (90 BASE) MCG/ACT IN AERS: 4.0000 | INHALATION_SPRAY | RESPIRATORY_TRACT | 1 refills | Status: AC | PRN

## 2024-02-08 NOTE — ED Notes (Signed)
 Mistake in charting: Mistakenly discontinued CBC and BMP under Lucie Macario Pinal, MD instead of Lucie Pinal, DO. Charge Nurse notified. Lucie Pinal, DO was authorizing provider who verbally ordered discontinuation of CBC and BMP.

## 2024-02-08 NOTE — Discharge Instructions (Signed)
 [facility]- Multiple Practices in [place], check the website  [facility]- same as above  [facility] [address] [place] Phone: [phone]  [facility]

## 2024-02-08 NOTE — ED Notes (Signed)
 Pt d/c instructions, medications, and follow-up care reviewed with pt. Pt verbalized understanding and had no further questions at time of d/c. Pt CA&Ox4, ambulatory, and in NAD at time of d/c

## 2024-02-08 NOTE — ED Provider Notes (Signed)
 [place] EMERGENCY DEPARTMENT AT [facility] Provider Note   CSN: [id number] Arrival date & time: 02/08/24  9260     Patient presents with: Shortness of Breath   Henry Schroeder is a 38 y.o. male.   PMH of asthma, reporting 1 week of increased inhaler use corresponding with a change in weather.  He reports that until last night it seemed to be helping but yesterday he became short of breath and more wheezy.  He attempted to use his home nebulizer but it did not have good effect.  He does not have frequent asthma exacerbations and is typically well-managed with intermittent albuterol  use.  No fevers chills cough or congestion at this time.   Shortness of Breath      Prior to Admission medications   Medication Sig Start Date End Date Taking? Authorizing Provider  albuterol  (PROVENTIL ) (2.5 MG/3ML) 0.083% nebulizer solution Take 3 mLs (2.5 mg total) by nebulization every 6 (six) hours as needed for wheezing or shortness of breath. 01/18/20   Long, Fonda MATSU, MD  albuterol  (VENTOLIN  HFA) 108 (90 Base) MCG/ACT inhaler Inhale 2 puffs into the lungs every 4 (four) hours as needed for wheezing or shortness of breath (cough). 01/18/20   Long, Joshua G, MD  diclofenac  Sodium (VOLTAREN ) 1 % GEL Apply 2 g topically 4 (four) times daily as needed. 01/18/20   Long, Fonda MATSU, MD  doxycycline  (VIBRAMYCIN ) 100 MG capsule Take 1 capsule (100 mg total) by mouth 2 (two) times daily. 11/30/23   Christopher Savannah, PA-C  ibuprofen  (ADVIL ) 800 MG tablet Take 1 tablet (800 mg total) by mouth every 8 (eight) hours as needed. 01/18/20   Long, Joshua G, MD  morphine (MSIR) 15 MG tablet Take 0.5 tablets (7.5 mg total) by mouth every 4 (four) hours as needed for severe pain (pain score 7-10). 01/07/24   Floyd, Dan, DO    Allergies: Shellfish allergy and Onion    Review of Systems  Respiratory:  Positive for shortness of breath.     Updated Vital Signs BP 129/81   Pulse 95   Temp 97.8 F (36.6 C) (Oral)    Resp (!) 34   SpO2 100%   Physical Exam Constitutional:      Appearance: He is well-developed.  Eyes:     Extraocular Movements: Extraocular movements intact.     Pupils: Pupils are equal, round, and reactive to light.  Cardiovascular:     Rate and Rhythm: Normal rate and regular rhythm.  Pulmonary:     Effort: Pulmonary effort is normal.     Breath sounds: Examination of the right-upper field reveals wheezing. Examination of the left-upper field reveals wheezing. Examination of the right-middle field reveals wheezing. Examination of the left-middle field reveals wheezing. Examination of the right-lower field reveals wheezing. Examination of the left-lower field reveals wheezing. Wheezing present.  Abdominal:     Palpations: Abdomen is soft.  Musculoskeletal:        General: Normal range of motion.  Skin:    General: Skin is warm and dry.  Neurological:     Mental Status: He is alert.     (all labs ordered are listed, but only abnormal results are displayed) Labs Reviewed  RESP PANEL BY RT-PCR (RSV, FLU A&B, COVID)  [id number]    EKG: EKG Interpretation Date/Time:  Thursday February 08 2024 07:48:11 EST Ventricular Rate:  92 PR Interval:  146 QRS Duration:  78 QT Interval:  343 QTC Calculation: 425 R Axis:   112  Text Interpretation: Right and left arm electrode reversal, interpretation assumes no reversal Sinus rhythm Baseline wander  TECHNICALLY DIFFICULT  Otherwise no significant change  Confirmed by Emil Share [phone]) on 02/08/2024 7:50:10 AM  Radiology: No results found.   Procedures   Medications Ordered in the ED  ipratropium-albuterol  (DUONEB) 0.5-2.5 (3) MG/3ML nebulizer solution 3 mL (3 mLs Nebulization Given 02/08/24 0802)  dexamethasone (DECADRON) tablet 10 mg (10 mg Oral Given 02/08/24 0813)  ipratropium-albuterol  (DUONEB) 0.5-2.5 (3) MG/3ML nebulizer solution 3 mL (3 mLs Nebulization Given 02/08/24 0803)                                    Medical  Decision Making This is an otherwise healthy 38 year old male with past medical history of mild intermittent asthma.  Given failure of home treatment will proceed with DuoNebs x 3 and give a dose of Decadron 10 mg and reevaluate.  Will also obtain viral respiratory swab this could be a cause of his exacerbation, though likely this sounds environmentally mediated.  Is negative for COVID flu and RSV, improved after administration of DuoNebs and Decadron.  Advised to take albuterol  4 puffs Q4 for the next 24 hours.  Patient does not have a PCP, provided with list of practices in the area.  Stable for discharge home at this time  Risk Prescription drug management.    Final diagnoses:  None    ED Discharge Orders     None          Cleotilde Lukes, DO 02/08/24 0901    Emil Share, DO 02/08/24 1217

## 2024-02-08 NOTE — ED Triage Notes (Signed)
 Patient reports shortness of breath for the week, worsening yesterday and today. Takes albuterol  at home and says it has not been helping. Denies any sick contacts. Primary RN heard inspiratory and expiratory wheezes.

## 2024-03-20 ENCOUNTER — Emergency Department (HOSPITAL_BASED_OUTPATIENT_CLINIC_OR_DEPARTMENT_OTHER): Payer: Self-pay | Admitting: Radiology

## 2024-03-20 ENCOUNTER — Other Ambulatory Visit: Payer: Self-pay

## 2024-03-20 ENCOUNTER — Other Ambulatory Visit: Payer: Self-pay | Admitting: Internal Medicine

## 2024-03-20 ENCOUNTER — Encounter (HOSPITAL_BASED_OUTPATIENT_CLINIC_OR_DEPARTMENT_OTHER): Payer: Self-pay | Admitting: Emergency Medicine

## 2024-03-20 ENCOUNTER — Emergency Department (HOSPITAL_BASED_OUTPATIENT_CLINIC_OR_DEPARTMENT_OTHER)
Admission: EM | Admit: 2024-03-20 | Discharge: 2024-03-20 | Disposition: A | Discharge status: Home / Self Care | Payer: Self-pay | Attending: Emergency Medicine | Admitting: Emergency Medicine

## 2024-03-20 DIAGNOSIS — Z720 Tobacco use: Secondary | ICD-10-CM

## 2024-03-20 DIAGNOSIS — F17200 Nicotine dependence, unspecified, uncomplicated: Secondary | ICD-10-CM | POA: Insufficient documentation

## 2024-03-20 DIAGNOSIS — J9601 Acute respiratory failure with hypoxia: Secondary | ICD-10-CM | POA: Insufficient documentation

## 2024-03-20 DIAGNOSIS — Z5329 Procedure and treatment not carried out because of patient's decision for other reasons: Secondary | ICD-10-CM | POA: Insufficient documentation

## 2024-03-20 DIAGNOSIS — J101 Influenza due to other identified influenza virus with other respiratory manifestations: Secondary | ICD-10-CM | POA: Insufficient documentation

## 2024-03-20 DIAGNOSIS — J4541 Moderate persistent asthma with (acute) exacerbation: Secondary | ICD-10-CM | POA: Insufficient documentation

## 2024-03-20 DIAGNOSIS — R000 Tachycardia, unspecified: Secondary | ICD-10-CM | POA: Insufficient documentation

## 2024-03-20 LAB — CBC
HCT: 49.2 % (ref 39.0–52.0)
Hemoglobin: 16.5 g/dL (ref 13.0–17.0)
MCH: 27.4 pg (ref 26.0–34.0)
MCHC: 33.5 g/dL (ref 30.0–36.0)
MCV: 81.7 fL (ref 80.0–100.0)
Platelets: 223 K/uL (ref 150–400)
RBC: 6.02 MIL/uL — ABNORMAL HIGH (ref 4.22–5.81)
RDW: 14.1 % (ref 11.5–15.5)
WBC: 8.7 K/uL (ref 4.0–10.5)
nRBC: 0 % (ref 0.0–0.2)

## 2024-03-20 LAB — BASIC METABOLIC PANEL WITH GFR
Anion gap: 11 (ref 5–15)
BUN: 7 mg/dL (ref 6–20)
CO2: 25 mmol/L (ref 22–32)
Calcium: 9.4 mg/dL (ref 8.9–10.3)
Chloride: 101 mmol/L (ref 98–111)
Creatinine, Ser: 0.93 mg/dL (ref 0.61–1.24)
GFR, Estimated: >60 mL/min
Glucose, Bld: 121 mg/dL — ABNORMAL HIGH (ref 70–99)
Potassium: 3.8 mmol/L (ref 3.5–5.1)
Sodium: 137 mmol/L (ref 135–145)

## 2024-03-20 LAB — RESP PANEL BY RT-PCR (RSV, FLU A&B, COVID)  RVPGX2
Influenza A by PCR: POSITIVE — AB
Influenza B by PCR: NEGATIVE
Resp Syncytial Virus by PCR: NEGATIVE
SARS Coronavirus 2 by RT PCR: NEGATIVE

## 2024-03-20 MED ORDER — MAGNESIUM SULFATE 2 GM/50ML IV SOLN
2.0000 g | Freq: Once | INTRAVENOUS | Status: AC
  Administered 2024-03-20: 2 g via INTRAVENOUS
  Filled 2024-03-20: qty 50

## 2024-03-20 MED ORDER — ALBUTEROL (5 MG/ML) CONTINUOUS INHALATION SOLN
10.0000 mg/h | INHALATION_SOLUTION | RESPIRATORY_TRACT | Status: AC
  Administered 2024-03-20: 10 mg/h via RESPIRATORY_TRACT
  Filled 2024-03-20: qty 20

## 2024-03-20 MED ORDER — OSELTAMIVIR PHOSPHATE 75 MG PO CAPS: 75.0000 mg | ORAL_CAPSULE | Freq: Two times a day (BID) | ORAL | 0 refills | Status: AC

## 2024-03-20 MED ORDER — IPRATROPIUM BROMIDE 0.02 % IN SOLN
1.0000 mg | Freq: Once | RESPIRATORY_TRACT | Status: AC
  Administered 2024-03-20: 1 mg via RESPIRATORY_TRACT
  Filled 2024-03-20: qty 5

## 2024-03-20 MED ORDER — ALBUTEROL SULFATE (2.5 MG/3ML) 0.083% IN NEBU
INHALATION_SOLUTION | RESPIRATORY_TRACT | Status: AC
  Administered 2024-03-20: 2.5 mg
  Filled 2024-03-20: qty 3

## 2024-03-20 MED ORDER — BUDESONIDE 0.25 MG/2ML IN SUSP: 0.2500 mg | Freq: Two times a day (BID) | RESPIRATORY_TRACT | 0 refills | Status: AC

## 2024-03-20 MED ORDER — GUAIFENESIN ER 600 MG PO TB12: 600.0000 mg | ORAL_TABLET | Freq: Two times a day (BID) | ORAL | 0 refills | Status: AC

## 2024-03-20 MED ORDER — METHYLPREDNISOLONE SODIUM SUCC 125 MG IJ SOLR
125.0000 mg | Freq: Once | INTRAMUSCULAR | Status: AC
  Administered 2024-03-20: 125 mg via INTRAVENOUS
  Filled 2024-03-20: qty 2

## 2024-03-20 MED ORDER — ALBUTEROL SULFATE (2.5 MG/3ML) 0.083% IN NEBU: 2.5000 mg | INHALATION_SOLUTION | RESPIRATORY_TRACT | 0 refills | Status: AC | PRN

## 2024-03-20 MED ORDER — ALBUTEROL SULFATE (2.5 MG/3ML) 0.083% IN NEBU
5.0000 mg | INHALATION_SOLUTION | Freq: Once | RESPIRATORY_TRACT | Status: AC
  Administered 2024-03-20: 5 mg via RESPIRATORY_TRACT
  Filled 2024-03-20: qty 6

## 2024-03-20 MED ORDER — ALBUTEROL SULFATE (2.5 MG/3ML) 0.083% IN NEBU: 2.5000 mg | INHALATION_SOLUTION | Freq: Once | RESPIRATORY_TRACT | Status: DC

## 2024-03-20 MED ORDER — NICOTINE 14 MG/24HR TD PT24: 14.0000 mg | MEDICATED_PATCH | TRANSDERMAL | 0 refills | Status: AC

## 2024-03-20 MED ORDER — PREDNISONE 20 MG PO TABS: 40.0000 mg | ORAL_TABLET | Freq: Every day | ORAL | 0 refills | Status: AC

## 2024-03-20 NOTE — ED Provider Notes (Signed)
 " [place] [facility]ERGENCY DEPARTMENT AT [facility] Provider Note   CSN: [id number] Arrival date & time: 03/20/24  1032     Patient presents with: Shortness of Breath   Henry Schroeder is a 38 y.o. male.   Hx of chronic astham and tobacco abuse, here for cough wheezing and sob. No hx of previous hospitalization or intubation. He is a daily smoker  The history is provided by the patient.  Shortness of Breath Severity:  Severe Onset quality:  Gradual Duration:  2 days Timing:  Constant Progression:  Worsening Chronicity:  Recurrent Context: URI   Relieved by:  Nothing Worsened by:  Coughing Ineffective treatments:  Inhaler Associated symptoms: cough and wheezing   Risk factors: tobacco use        Prior to Admission medications  Medication Sig Start Date End Date Taking? Authorizing Provider  albuterol  (PROAIR  HFA) 108 (90 Base) MCG/ACT inhaler Inhale 4 puffs into the lungs every 4 (four) hours as needed for wheezing or shortness of breath. Use every 4 hours scheduled for the first 24 hours, then as needed. 02/08/24  Yes Cleotilde Lukes, DO  albuterol  (PROVENTIL ) (2.5 MG/3ML) 0.083% nebulizer solution Take 3 mLs (2.5 mg total) by nebulization every 6 (six) hours as needed for wheezing or shortness of breath. 01/18/20  Yes Long, Fonda MATSU, MD  albuterol  (VENTOLIN  HFA) 108 (90 Base) MCG/ACT inhaler Inhale 2 puffs into the lungs every 4 (four) hours as needed for wheezing or shortness of breath (cough). Patient not taking: Reported on 03/20/2024 01/18/20   Darra Fonda MATSU, MD  doxycycline  (VIBRAMYCIN ) 100 MG capsule Take 1 capsule (100 mg total) by mouth 2 (two) times daily. Patient not taking: Reported on 03/20/2024 11/30/23   Christopher Savannah, PA-C  morphine  (MSIR) 15 MG tablet Take 0.5 tablets (7.5 mg total) by mouth every 4 (four) hours as needed for severe pain (pain score 7-10). Patient not taking: Reported on 03/20/2024 01/07/24   Emil Share, DO  predniSONE  (DELTASONE ) 20 MG  tablet Take 2 tablets (40 mg total) by mouth daily. Patient not taking: Reported on 03/20/2024 02/08/24   Cleotilde Lukes, DO    Allergies: Shellfish allergy and Onion    Review of Systems  Respiratory:  Positive for cough, shortness of breath and wheezing.     Updated Vital Signs BP 135/71   Pulse (!) 134   Temp 98.6 F (37 C)   Resp (!) 21   Ht 5' 8 (1.727 m)   Wt 95.3 kg   SpO2 96%   BMI 31.93 kg/m   Physical Exam Constitutional:      Appearance: He is ill-appearing.  HENT:     Head: Normocephalic and atraumatic.  Eyes:     Extraocular Movements: Extraocular movements intact.     Pupils: Pupils are equal, round, and reactive to light.  Cardiovascular:     Rate and Rhythm: Tachycardia present.  Pulmonary:     Effort: Tachypnea and respiratory distress present.     Breath sounds: Examination of the right-upper field reveals wheezing. Examination of the left-upper field reveals wheezing. Examination of the right-middle field reveals decreased breath sounds. Examination of the left-middle field reveals decreased breath sounds. Examination of the right-lower field reveals decreased breath sounds. Examination of the left-lower field reveals decreased breath sounds. Decreased breath sounds and wheezing present.     Comments: Patient in mod resp distress. Prolonged expiratory phase  Minimal wheezing in upper  Musculoskeletal:     Right lower leg: No edema.  Left lower leg: No edema.  Neurological:     Mental Status: He is alert.     (all labs ordered are listed, but only abnormal results are displayed) Labs Reviewed  RESP PANEL BY RT-PCR (RSV, FLU A&B, COVID)  RVPGX2 - Abnormal; Notable for the following components:      Result Value   Influenza A by PCR POSITIVE (*)    All other components within normal limits  BASIC METABOLIC PANEL WITH GFR - Abnormal; Notable for the following components:   Glucose, Bld 121 (*)    All other components within normal limits   CBC - Abnormal; Notable for the following components:   RBC 6.02 (*)    All other components within normal limits    EKG: EKG Interpretation Date/Time:  Wednesday March 20 2024 10:47:37 EST Ventricular Rate:  126 PR Interval:  138 QRS Duration:  69 QT Interval:  289 QTC Calculation: 419 R Axis:   61  Text Interpretation: Sinus tachycardia Confirmed by Levander Houston [phone]) on 03/20/2024 1:01:47 PM  Radiology: [facility] Chest 2 View Result Date: 03/20/2024 CLINICAL DATA:  Shortness of breath. EXAM: CHEST - 2 VIEW COMPARISON:  February 15, 2018 FINDINGS: The heart size and mediastinal contours are within normal limits. Both lungs are clear. The visualized skeletal structures are unremarkable. IMPRESSION: No active cardiopulmonary disease. Electronically Signed   By: Suzen Dials M.D.   On: 03/20/2024 11:46     .Critical Care  Performed by: Arloa Chroman, PA-C Authorized by: Arloa Chroman, PA-C   Critical care provider statement:    Critical care time (minutes):  75   Critical care time was exclusive of:  Separately billable procedures and treating other patients   Critical care was necessary to treat or prevent imminent or life-threatening deterioration of the following conditions:  Respiratory failure   Critical care was time spent personally by me on the following activities:  Development of treatment plan with patient or surrogate, discussions with consultants, evaluation of patient's response to treatment, examination of patient, ordering and review of laboratory studies, ordering and review of radiographic studies, ordering and performing treatments and interventions, pulse oximetry, re-evaluation of patient's condition, review of old charts, interpretation of cardiac output measurements and obtaining history from patient or surrogate    Medications Ordered in the ED  albuterol  (PROVENTIL ) (2.5 MG/3ML) 0.083% nebulizer solution 2.5 mg (has no administration in time  range)  albuterol  (PROVENTIL ,VENTOLIN ) solution continuous neb (10 mg/hr Nebulization New Bag/Given 03/20/24 1351)  albuterol  (PROVENTIL ) (2.5 MG/3ML) 0.083% nebulizer solution (2.5 mg  Given 03/20/24 1045)  methylPREDNISolone  sodium succinate (SOLU-MEDROL ) 125 mg/2 mL injection 125 mg (125 mg Intravenous Given 03/20/24 1120)  magnesium  sulfate IVPB 2 g 50 mL (0 g Intravenous Stopped 03/20/24 1200)  albuterol  (PROVENTIL ) (2.5 MG/3ML) 0.083% nebulizer solution 5 mg (5 mg Nebulization Given 03/20/24 1112)  ipratropium (ATROVENT ) nebulizer solution 1 mg (1 mg Nebulization Given 03/20/24 1111)    Clinical Course as of 03/20/24 1753  Wed Mar 20, 2024  1339 Reassessed - minimal improvement after neb treatment. Wheezing now heard at bases. Hour long neb ordered [AH]  1534 Patient was feeling better after hour-long neb treatment.  I ordered ambulation with pulse ox monitoring.  Respiratory therapist reports that upon standing next to the bed he immediately dropped to an oxygen saturation of 88% and heart rate set up to 150.  She placed him back into bed and and sat him on 2 L. [AH]  1703 Patient stating that he wants  to leave.  I discussed leaving AGAINST MEDICAL ADVICE and that given his hypoxic respiratory failure severe asthma and flu that he has high risk for potential severe outcome and or death.  Patient has decided to stay at this time. [AH]  1741 Patient was assessed by Dr. Fausto patient now declining admission.  He will need to sign AMA paperwork.  She has sent medications to his pharmacy. [AH]    Clinical Course User Index [AH] Arloa Chroman, PA-C                                 Medical Decision Making Amount and/or Complexity of Data Reviewed Labs: ordered. Radiology: ordered.  Risk Prescription drug management. Decision regarding hospitalization.   This patient presents to the ED for concern of sob, this involves an extensive number of treatment options, and is a complaint  that carries with it a high risk of complications and morbidity.   The emergent differential diagnosis for shortness of breath includes, but is not limited to, Pulmonary edema, bronchoconstriction, Pneumonia, Pulmonary embolism, Pneumotherax/ Hemothorax, Dysrythmia, ACS.    Co morbidities:  Past Medical History:  Diagnosis Date   Asthma      Social Determinants of Health:   SDOH Screenings   Social Connections: Unknown (07/25/2021)   Received from [facility]  Tobacco Use: High Risk (03/20/2024)     Additional history:     Lab Tests:  I Ordered, and personally interpreted labs.  The pertinent results include:  , no elevated white blood cell count labs reviewed, glucose 121 respiratory panel positive for flu  Imaging Studies:  I ordered imaging studies including two-view chest x-ray I independently visualized and interpreted imaging which showed no acute finding I agree with the radiologist interpretation  Cardiac Monitoring/ECG:  The patient was maintained on a cardiac monitor.  I personally viewed and interpreted the cardiac monitored which showed an underlying rhythm of: Sinus tachycardia  Medicines ordered and prescription drug management:  I ordered medication including  Medications  albuterol  (PROVENTIL ) (2.5 MG/3ML) 0.083% nebulizer solution 2.5 mg (has no administration in time range)  albuterol  (PROVENTIL ,VENTOLIN ) solution continuous neb (10 mg/hr Nebulization New Bag/Given 03/20/24 1351)  albuterol  (PROVENTIL ) (2.5 MG/3ML) 0.083% nebulizer solution (2.5 mg  Given 03/20/24 1045)  methylPREDNISolone  sodium succinate (SOLU-MEDROL ) 125 mg/2 mL injection 125 mg (125 mg Intravenous Given 03/20/24 1120)  magnesium  sulfate IVPB 2 g 50 mL (0 g Intravenous Stopped 03/20/24 1200)  albuterol  (PROVENTIL ) (2.5 MG/3ML) 0.083% nebulizer solution 5 mg (5 mg Nebulization Given 03/20/24 1112)  ipratropium (ATROVENT ) nebulizer solution 1 mg (1 mg Nebulization Given  03/20/24 1111)   for respiratory failure Reevaluation of the patient after these medicines showed that the patient improved I have reviewed the patients home medicines and have made adjustments as needed  Test Considered:   standard CT chest however symptoms are consistent with asthma exacerbation in the setting of influenza A  Critical Interventions:    Consultations Obtained: With hospitalist for admission  Problem List / ED Course:     ICD-10-CM   1. Acute respiratory failure with hypoxia (HCC)  J96.01     2. Influenza A  J10.1     3. Moderate persistent asthma with acute exacerbation  J45.41     4. Tobacco abuse  Z72.0       MDM: 38 year old male here with moderate respiratory distress multiple rounds of albuterol  neb treatments with hypoxic respiratory failure.  Patient admitted to the hospital but then declined.  I had a long discussion with the patient and bedside about risks and he is leaving AGAINST MEDICAL ADVICE.  Date: 03/20/2024 Patient: Zymeir Salminen Admitted: 03/20/2024 10:40 AM Attending Provider: Levander Houston, MD  Henry Schroeder or his authorized caregiver has made the decision for the patient to leave the emergency department against the advice of Levander Houston, MD.  He or his authorized caregiver has been informed and understands the inherent risks, including death.  He or his authorized caregiver has decided to accept the responsibility for this decision. Henry Schroeder and all necessary parties have been advised that he may return for further evaluation or treatment. His condition at time of discharge was Serious.  Henry Schroeder had current vital signs as follows:  Blood pressure 135/71, pulse (!) 134, temperature 98.6 F (37 C), resp. rate (!) 21, height 5' 8 (1.727 m), weight 95.3 kg, SpO2 96%.   Henry Schroeder or his authorized caregiver has signed the Leaving Against Medical Advice form prior to leaving the department.  Henry Schroeder 03/20/2024         Patient states     Final diagnoses:  Acute respiratory failure with hypoxia (HCC)  Influenza A  Moderate persistent asthma with acute exacerbation  Tobacco abuse    ED Discharge Orders     None          Schroeder Lavanda, PA-C 03/20/24 1753    Levander Houston, MD 03/25/24 1231  "

## 2024-03-20 NOTE — ED Notes (Signed)
 RT received order to ambulate pt in the hall, resting SP02: 92% room air, Sp02 dropped to 88% after standing and pacing x 1 minute.  RT had pt sit back down on the bed and applied 3 L [place], Sp02 increased to 93%. Notified MD of results.

## 2024-03-20 NOTE — ED Triage Notes (Signed)
 SOB x 2 days. Hx of asthma, using inhaler frequently without relief.

## 2024-03-20 NOTE — ED Notes (Signed)
" °   03/20/24 1228  Oxygen Therapy/Pulse Ox  O2 Device Room Air (RT attempted to apply [place] @ 2 L, pt immediately refusing due to oxygen burns my nose. I explained the need, and he agreed to try 1 L [place]. Sp02 increased to 93% post change.)  SpO2 (!) 89 %    "

## 2024-03-20 NOTE — ED Notes (Signed)
 Our P.A., Donovan has just informed him of need to admit d/t hypoxemia while ambulating (88%). His significant other remains with him.

## 2024-03-20 NOTE — Plan of Care (Signed)
 Telemedicine Consult Note --  I was paged for admission for Henry Schroeder for acute respiratory failure with hypoxia due to influenza A infection and asthma exacerbation.  Pt was seen and evaluated virtually, but declines to be admitted to the hospital at this time, citing an unexpected family emergency.     Referring Provider: Lavanda Lesches, PA-C Telemedicine Provider: Burnard Cunning, DO Patient Location: [facility] Referring Diagnosis: Acute respiratory failure with hypoxia due to influenza A and asthma exacerbation Patient Name and DOB verified: Henry Schroeder, 1985/12/13 Patient consented to Telemedicine Evaluation:Yes RN virtual assistant: Velinda Sharps, RN Video encounter time and date: 03/20/2024 5:28 PM   Bedside physical exam was performed by RN listed above. Below exam findings are based on their in person physical exam findings and my observations during virtual encounter. General exam: awake, alert, no acute distress HEENT: voice clear, hearing grossly normal  Respiratory system: severely diminished, high-pitched expiratory wheezes in all fields, no conversational dyspnea, on room air Central nervous system: A&O x3. no gross focal neurologic deficits, normal speech Extremities: moves all Psychiatry: normal mood, congruent affect, judgement and insight appear normal   A&P --   Patient advised of importance of admission, due to requiring oxygen based on reports of desats into 80's on room air with minimal exertion just standing at bedside and despite all medical treatments previously given in the ED. Explained risks of leaving AMA. Gave strict return to ED instructions for persistent O2 sats < 89%.  Pt expressed understanding and agreement.  --In the setting of family emergency, will send Rx's to treat flu and asthma exacerbation to pharmacy:  -- Tamiflu  75 mg BID x 5 days  -- Pulmicort  nebs BID x 7 days  -- Albuterol  nebs Q2H PRN  -- Prednisone  40 mg x 5 day course  --Nicotine   patches 14 mg & strongly advised pt avoid smoking & pursue cessation

## 2024-03-20 NOTE — ED Notes (Signed)
 He tells me he feels a lot better and is ready to go home.

## 2024-03-20 NOTE — Discharge Instructions (Signed)
 You have been diagnosed with influenza A, respiratory failure with hypoxia and are advised to be admitted to the hospital due to your respiratory failure.  You are choosing to leave AGAINST MEDICAL ADVICE.  You are free to return at any time for admission as you are at high risk for worsening in your condition including and up to death. Get help right away if: You are having trouble breathing. You lose consciousness. Your heart starts beating very fast. Your fingers, lips, or other areas of your body turn blue. You are confused. These symptoms may be an emergency. Get help right away. Call 911. Do not wait to see if the symptoms will go away. Do not drive yourself to the hospital
